# Patient Record
Sex: Male | Born: 1970 | Race: White | Hispanic: No | Marital: Married | Smoking: Former smoker
Health system: Southern US, Community
[De-identification: ages and names within clinical notes are randomized; demographics above are authoritative.]

## PROBLEM LIST (undated history)

## (undated) HISTORY — PX: OTHER SURGICAL HISTORY: SHX169

---

## 2001-07-24 ENCOUNTER — Encounter: Payer: Self-pay | Admitting: Emergency Medicine

## 2001-07-24 ENCOUNTER — Emergency Department (HOSPITAL_COMMUNITY): Admission: EM | Admit: 2001-07-24 | Discharge: 2001-07-24 | Payer: Self-pay | Admitting: *Deleted

## 2001-07-27 ENCOUNTER — Encounter: Payer: Self-pay | Admitting: Emergency Medicine

## 2001-07-27 ENCOUNTER — Emergency Department (HOSPITAL_COMMUNITY): Admission: EM | Admit: 2001-07-27 | Discharge: 2001-07-27 | Payer: Self-pay | Admitting: Emergency Medicine

## 2001-08-07 ENCOUNTER — Emergency Department (HOSPITAL_COMMUNITY): Admission: EM | Admit: 2001-08-07 | Discharge: 2001-08-07 | Payer: Self-pay | Admitting: Emergency Medicine

## 2002-01-14 ENCOUNTER — Emergency Department (HOSPITAL_COMMUNITY): Admission: EM | Admit: 2002-01-14 | Discharge: 2002-01-14 | Payer: Self-pay | Admitting: Emergency Medicine

## 2005-01-17 ENCOUNTER — Ambulatory Visit: Payer: Self-pay | Admitting: Internal Medicine

## 2006-05-18 ENCOUNTER — Emergency Department (HOSPITAL_COMMUNITY): Admission: EM | Admit: 2006-05-18 | Discharge: 2006-05-18 | Payer: Self-pay | Admitting: Emergency Medicine

## 2008-04-02 ENCOUNTER — Emergency Department (HOSPITAL_COMMUNITY): Admission: EM | Admit: 2008-04-02 | Discharge: 2008-04-02 | Payer: Self-pay | Admitting: Emergency Medicine

## 2010-09-15 ENCOUNTER — Emergency Department (HOSPITAL_COMMUNITY)
Admission: EM | Admit: 2010-09-15 | Discharge: 2010-09-15 | Disposition: A | Discharge status: Home / Self Care | Payer: Self-pay | Attending: Emergency Medicine | Admitting: Emergency Medicine

## 2010-09-15 DIAGNOSIS — J069 Acute upper respiratory infection, unspecified: Secondary | ICD-10-CM | POA: Insufficient documentation

## 2010-09-15 DIAGNOSIS — R05 Cough: Secondary | ICD-10-CM | POA: Insufficient documentation

## 2010-09-15 DIAGNOSIS — R059 Cough, unspecified: Secondary | ICD-10-CM | POA: Insufficient documentation

## 2010-09-15 LAB — RAPID STREP SCREEN (MED CTR MEBANE ONLY): Streptococcus, Group A Screen (Direct): NEGATIVE

## 2011-01-03 ENCOUNTER — Emergency Department (HOSPITAL_COMMUNITY)
Admission: EM | Admit: 2011-01-03 | Discharge: 2011-01-03 | Disposition: A | Discharge status: Home / Self Care | Payer: Self-pay | Attending: Emergency Medicine | Admitting: Emergency Medicine

## 2011-01-03 DIAGNOSIS — S335XXA Sprain of ligaments of lumbar spine, initial encounter: Secondary | ICD-10-CM | POA: Insufficient documentation

## 2011-01-03 DIAGNOSIS — X503XXA Overexertion from repetitive movements, initial encounter: Secondary | ICD-10-CM | POA: Insufficient documentation

## 2011-01-03 DIAGNOSIS — M549 Dorsalgia, unspecified: Secondary | ICD-10-CM | POA: Insufficient documentation

## 2012-01-16 ENCOUNTER — Ambulatory Visit (HOSPITAL_BASED_OUTPATIENT_CLINIC_OR_DEPARTMENT_OTHER)
Admission: RE | Admit: 2012-01-16 | Discharge: 2012-01-16 | Disposition: A | Discharge status: Home / Self Care | Payer: BC Managed Care – PPO | Source: Ambulatory Visit | Attending: Internal Medicine | Admitting: Internal Medicine

## 2012-01-16 ENCOUNTER — Ambulatory Visit (INDEPENDENT_AMBULATORY_CARE_PROVIDER_SITE_OTHER): Payer: BC Managed Care – PPO | Admitting: Internal Medicine

## 2012-01-16 ENCOUNTER — Other Ambulatory Visit: Payer: Self-pay | Admitting: Internal Medicine

## 2012-01-16 ENCOUNTER — Encounter: Payer: Self-pay | Admitting: Internal Medicine

## 2012-01-16 VITALS — BP 110/64 | HR 89 | Temp 98.3°F | Resp 16 | Ht 71.0 in | Wt 185.0 lb

## 2012-01-16 DIAGNOSIS — N509 Disorder of male genital organs, unspecified: Secondary | ICD-10-CM | POA: Insufficient documentation

## 2012-01-16 DIAGNOSIS — N50811 Right testicular pain: Secondary | ICD-10-CM

## 2012-01-16 DIAGNOSIS — M25529 Pain in unspecified elbow: Secondary | ICD-10-CM

## 2012-01-16 DIAGNOSIS — I861 Scrotal varices: Secondary | ICD-10-CM | POA: Insufficient documentation

## 2012-01-16 DIAGNOSIS — N50819 Testicular pain, unspecified: Secondary | ICD-10-CM

## 2012-01-16 MED ORDER — LEVOFLOXACIN 500 MG PO TABS: 500.0000 mg | ORAL_TABLET | Freq: Every day | ORAL | Status: DC

## 2012-01-16 MED ORDER — DICLOFENAC SODIUM 75 MG PO TBEC: DELAYED_RELEASE_TABLET | ORAL | Status: AC

## 2012-01-17 LAB — URINALYSIS, ROUTINE W REFLEX MICROSCOPIC

## 2012-01-21 DIAGNOSIS — M25529 Pain in unspecified elbow: Secondary | ICD-10-CM | POA: Insufficient documentation

## 2012-01-21 DIAGNOSIS — N50819 Testicular pain, unspecified: Secondary | ICD-10-CM | POA: Insufficient documentation

## 2012-01-21 NOTE — Assessment & Plan Note (Signed)
Consider epicondylitis. Attempt Voltaren with food and no other anti-inflammatories. Followup if no improvement or worsening.

## 2012-01-21 NOTE — Progress Notes (Signed)
  Subjective:    Patient ID: Dwayne Jordan, male    DOB: [birth year], 41 y.o.   MRN: [id number]  HPI patient presents to clinic for evaluation of right testicular pain. Notes intermittent discomfort over the past month of right testicle sometimes radiating into the groin. No urinary symptoms denying dysuria frequency or urethral discharge. No alleviating or exacerbating factors. Also notes left elbow pain in the general area of the chest up border vs. epicondylar area. Denies neck pain or radicular arm pain. Pain worse with pronation.  No past medical history on file. Past Surgical History  Procedure Date  . Unremarkable     reports that he has been smoking Cigarettes.  He does not have any smokeless tobacco history on file. His alcohol and drug histories not on file. family history includes Hypertension in his father. No Known Allergies   Review of Systems  Genitourinary: Positive for testicular pain. Negative for dysuria, urgency, frequency, hematuria, difficulty urinating and penile pain.  Musculoskeletal: Positive for arthralgias. Negative for myalgias, back pain and joint swelling.  All other systems reviewed and are negative.       Objective:   Physical Exam  Nursing note and vitals reviewed. Constitutional: He appears well-developed and well-nourished. No distress.  Genitourinary:       Bilaterally descended testes. No nodularity noted. Left epididymal tenderness without mass.  Musculoskeletal:       Left elbow no erythema warmth or effusion. Full range of motion. Tenderness near the lateral epicondyle. No bony abnormality.  Skin: He is not diaphoretic.          Assessment & Plan:

## 2012-01-21 NOTE — Assessment & Plan Note (Signed)
Obtain testicular ultrasound. Obtain urinalysis. Consider epididymitis and begin Levaquin. Followup improvement or worsening.

## 2012-02-06 ENCOUNTER — Ambulatory Visit: Payer: BC Managed Care – PPO | Admitting: Internal Medicine

## 2012-02-15 ENCOUNTER — Ambulatory Visit: Payer: BC Managed Care – PPO | Admitting: Internal Medicine

## 2012-02-21 ENCOUNTER — Ambulatory Visit (INDEPENDENT_AMBULATORY_CARE_PROVIDER_SITE_OTHER): Payer: BC Managed Care – PPO | Admitting: Internal Medicine

## 2012-02-21 ENCOUNTER — Encounter: Payer: Self-pay | Admitting: Internal Medicine

## 2012-02-21 VITALS — BP 108/78 | HR 76 | Temp 98.2°F | Resp 16 | Wt 185.5 lb

## 2012-02-21 DIAGNOSIS — N509 Disorder of male genital organs, unspecified: Secondary | ICD-10-CM

## 2012-02-21 DIAGNOSIS — N50819 Testicular pain, unspecified: Secondary | ICD-10-CM

## 2012-02-21 MED ORDER — LEVOFLOXACIN 500 MG PO TABS: 500.0000 mg | ORAL_TABLET | Freq: Every day | ORAL | Status: AC

## 2012-02-25 NOTE — Progress Notes (Signed)
  Subjective:    Patient ID: Dwayne Jordan, male    DOB: July 16, 1970, 41 y.o.   MRN: [id number]  HPI Pt presents to clinic for followup of multiple medical problems. Recent right testicular and inguinal pain improved but not resolved with antibiotic course. No dysuria or hematuria fever or chills. No alleviating or exacerbating factors. Declines influenza vaccine  No past medical history on file. Past Surgical History  Procedure Date  . Unremarkable     reports that he has been smoking Cigarettes.  He does not have any smokeless tobacco history on file. His alcohol and drug histories not on file. family history includes Hypertension in his father. No Known Allergies    Review of Systems see hpi    Objective:   Physical Exam  Nursing note and vitals reviewed. Constitutional: He appears well-developed and well-nourished. No distress.  HENT:  Head: Normocephalic and atraumatic.  Skin: He is not diaphoretic.          Assessment & Plan:

## 2012-02-25 NOTE — Assessment & Plan Note (Signed)
Improved. Reattempt antibiotic course. Followup if no improvement or worsening.

## 2012-03-27 ENCOUNTER — Telehealth: Payer: Self-pay | Admitting: Internal Medicine

## 2012-03-27 MED ORDER — DICLOFENAC SODIUM 75 MG PO TBEC: 75.0000 mg | DELAYED_RELEASE_TABLET | Freq: Two times a day (BID) | ORAL | Status: DC | PRN

## 2012-03-27 NOTE — Telephone Encounter (Signed)
Rx to pharmacy/[facility] 

## 2012-03-27 NOTE — Telephone Encounter (Signed)
Error/kjh 

## 2012-03-27 NOTE — Telephone Encounter (Signed)
Rx last filled 01/16/12 #30 x no refills. Take 1 tablet twice daily with food as needed for elbow pain.  Please advise re: future refills.

## 2012-03-27 NOTE — Telephone Encounter (Signed)
Pharmacist Daron Offer. [facility] [address] - states pt called here for a refill of Voltaren. [facility] location closed. He has no orders on this pt and would need that sent over.

## 2012-03-27 NOTE — Telephone Encounter (Signed)
75mg  bid prn #30 rf 1

## 2012-09-24 ENCOUNTER — Ambulatory Visit (INDEPENDENT_AMBULATORY_CARE_PROVIDER_SITE_OTHER): Payer: Self-pay | Admitting: Family

## 2012-09-24 ENCOUNTER — Ambulatory Visit (HOSPITAL_BASED_OUTPATIENT_CLINIC_OR_DEPARTMENT_OTHER)
Admission: RE | Admit: 2012-09-24 | Discharge: 2012-09-24 | Disposition: A | Discharge status: Home / Self Care | Payer: Self-pay | Source: Ambulatory Visit | Attending: Family | Admitting: Family

## 2012-09-24 ENCOUNTER — Encounter (HOSPITAL_BASED_OUTPATIENT_CLINIC_OR_DEPARTMENT_OTHER): Payer: Self-pay

## 2012-09-24 ENCOUNTER — Encounter: Payer: Self-pay | Admitting: Family

## 2012-09-24 VITALS — BP 110/70 | HR 68 | Temp 97.8°F | Resp 16 | Wt 197.0 lb

## 2012-09-24 DIAGNOSIS — N50819 Testicular pain, unspecified: Secondary | ICD-10-CM

## 2012-09-24 DIAGNOSIS — N509 Disorder of male genital organs, unspecified: Secondary | ICD-10-CM

## 2012-09-24 DIAGNOSIS — R1031 Right lower quadrant pain: Secondary | ICD-10-CM | POA: Insufficient documentation

## 2012-09-24 MED ORDER — IOHEXOL 300 MG/ML  SOLN
100.0000 mL | Freq: Once | INTRAMUSCULAR | Status: AC | PRN
  Administered 2012-09-24: 100 mL via INTRAVENOUS

## 2012-09-24 NOTE — Assessment & Plan Note (Addendum)
Need to rule out Inguinal hernia and acute appendicitis.. Will order CT abdomen/pelvis.

## 2012-09-24 NOTE — Patient Instructions (Addendum)
Please complete CT scan on the first floor. We will contact you with your results.  Call if symptoms worsen, or if not improved in 1 week.

## 2012-09-24 NOTE — Progress Notes (Signed)
  Subjective:    Patient ID: Dwayne Jordan, male    DOB: Apr 28, 1971, 42 y.o.   MRN: [id number]  HPI  Dwayne Jordan is a 42 yr old male who presents today with chief complaint of right testicular pain. Pain started Saturday.  Patient notes that he had some associated right lower abdominal pain. Stabbing pain, not made worse by lifting.  Denies testicular swelling, denies redness, denies fever.  Denies hematuria or dysuria.  Reports previous hx of similar symptoms. Chart reviewed- he had neg testicular ultrasound at that time. He reports a lot of heavy lifting at his job.  Review of Systems    see HPI  No past medical history on file.  History   Social History  . Marital Status: Married    Spouse Name: N/A    Number of Children: N/A  . Years of Education: N/A   Occupational History  . Not on file.   Social History Main Topics  . Smoking status: Current Every Day Smoker    Types: Cigarettes  . Smokeless tobacco: Not on file  . Alcohol Use: Not on file  . Drug Use: Not on file  . Sexually Active: Not on file   Other Topics Concern  . Not on file   Social History Narrative  . No narrative on file    Past Surgical History  Procedure Laterality Date  . Unremarkable      Family History  Problem Relation Age of Onset  . Hypertension Dwayne Jordan     No Known Allergies  Current Outpatient Prescriptions on File Prior to Visit  Medication Sig Dispense Refill  . diclofenac (VOLTAREN) 75 MG EC tablet Take 1 tablet (75 mg total) by mouth 2 (two) times daily as needed.  30 tablet  1   No current facility-administered medications on file prior to visit.    BP 110/70  Pulse 68  Temp(Src) 97.8 F (36.6 C) (Oral)  Resp 16  Wt 197 lb (89.359 kg)  BMI 27.49 kg/m2  SpO2 99%    Objective:   Physical Exam  Constitutional: He is oriented to person, place, and time. He appears well-developed and well-nourished. No distress.  HENT:  Head: Normocephalic and atraumatic.   Cardiovascular: Normal rate and regular rhythm.   No murmur heard. Pulmonary/Chest: Effort normal and breath sounds normal. No respiratory distress. He has no wheezes. He has no rales. He exhibits no tenderness.  Abdominal:  + RLQ tenderness   Neurological: He is alert and oriented to person, place, and time.  Skin: Skin is warm and dry.  Psychiatric: He has a normal mood and affect. His behavior is normal. Judgment and thought content normal.  GU: R testicle is without tenderness and without swelling on exam, no testicular masses.        Assessment & Plan:

## 2012-09-25 ENCOUNTER — Telehealth: Payer: Self-pay | Admitting: Family

## 2012-09-25 ENCOUNTER — Telehealth: Payer: Self-pay | Admitting: Internal Medicine

## 2012-09-25 MED ORDER — CIPROFLOXACIN HCL 500 MG PO TABS: 500.0000 mg | ORAL_TABLET | Freq: Two times a day (BID) | ORAL | Status: DC

## 2012-09-25 MED ORDER — LEVOFLOXACIN 500 MG PO TABS: 500.0000 mg | ORAL_TABLET | Freq: Every day | ORAL | Status: DC

## 2012-09-25 NOTE — Telephone Encounter (Signed)
Please advise dose / directions of levaquin as I do not see rx on current med list.

## 2012-09-25 NOTE — Telephone Encounter (Signed)
Rx sent for cipro. Hopefully this is cheaper.  I think it is on 4$ plan at [facility] if they find it too expensive at [facility].

## 2012-09-25 NOTE — Telephone Encounter (Signed)
Pain could be related to prostatitis.  Plan levaquin 500mg  daily x 10 days.  rx sent to pharmacy.

## 2012-09-25 NOTE — Telephone Encounter (Signed)
Pls call pt and let him know CT is normal. No hernia.  I recommend levaquin as ordered yesterday. Pt to call if symptoms worsen or if not improved in 2-3 days.

## 2012-09-25 NOTE — Telephone Encounter (Signed)
Patients wife called in stating that patient has no insurance and the medicine that Melissa prescribed is over $100. Is there anything else that patient could take that would be cheaper?

## 2012-09-25 NOTE — Telephone Encounter (Signed)
Patient is requesting CT results from yesterday

## 2012-09-25 NOTE — Telephone Encounter (Signed)
Patient came to the office.  Read him the information on the CT.  Please have [facility] [address]

## 2012-09-25 NOTE — Telephone Encounter (Signed)
Unable to leave message on pt's cell#.

## 2012-09-25 NOTE — Telephone Encounter (Signed)
Patients wife called regarding this. I explained to her that her husband did not sign a DPR and that we could not release info to her, only to him. I explained that someone would give patient a call when we know of results.

## 2012-09-26 NOTE — Telephone Encounter (Signed)
Notified pt's wife  

## 2012-10-10 ENCOUNTER — Ambulatory Visit: Payer: Self-pay | Admitting: Internal Medicine

## 2012-10-15 ENCOUNTER — Ambulatory Visit: Payer: Self-pay | Admitting: Family

## 2012-10-15 DIAGNOSIS — Z0289 Encounter for other administrative examinations: Secondary | ICD-10-CM

## 2012-10-17 ENCOUNTER — Ambulatory Visit (INDEPENDENT_AMBULATORY_CARE_PROVIDER_SITE_OTHER): Payer: Self-pay | Admitting: Family

## 2012-10-17 ENCOUNTER — Encounter: Payer: Self-pay | Admitting: Family

## 2012-10-17 VITALS — BP 126/80 | HR 62 | Temp 97.9°F | Resp 16 | Wt 200.1 lb

## 2012-10-17 DIAGNOSIS — N50811 Right testicular pain: Secondary | ICD-10-CM

## 2012-10-17 DIAGNOSIS — N50819 Testicular pain, unspecified: Secondary | ICD-10-CM

## 2012-10-17 DIAGNOSIS — N509 Disorder of male genital organs, unspecified: Secondary | ICD-10-CM

## 2012-10-17 MED ORDER — LEVOFLOXACIN 500 MG PO TABS: 500.0000 mg | ORAL_TABLET | Freq: Every day | ORAL | Status: DC

## 2012-10-17 NOTE — Assessment & Plan Note (Signed)
Will obtain Ua/Culture, GC/Chlamydia.  Re-attempt empiric levaquin for possible epididymitis/prostatitis . Refer to urology for further evaluation. The pt is instructed to go to the ER if sever abdominal pain- verbalizes understanding.

## 2012-10-17 NOTE — Patient Instructions (Signed)
You will be contacted about your referral to urology.   Please complete lab work prior to leaving.

## 2012-10-17 NOTE — Progress Notes (Signed)
  Subjective:    Patient ID: Dwayne Jordan, male    DOB: 1971-03-09, 42 y.o.   MRN: [id number]  HPI  Dwayne Jordan is a 42 yr old male who presents today with chief complaint of RLQ pain which has been present x 1 week. Describes pain as starting in the right testicle and shooting up into the right lower abdomen.  Pain is intermittent and has occurred 3-4 times over the last week.  He was evaluated for same in September 9/13 and underwent testicular US which was unremarkable.  He was treated with levaquin and symptoms improved. He then presented in May with similar symptoms and a CT abd/pelvis was performed and was also unremarkable.  He was treated with levaquin and reports that symptoms resolved briefly. Denies dysuria, denies fever, testicular swelling.  Denies aggravating or alleviating factors.   Review of Systems See HPI  No past medical history on file.  History   Social History  . Marital Status: Married    Spouse Name: N/A    Number of Children: N/A  . Years of Education: N/A   Occupational History  . Not on file.   Social History Main Topics  . Smoking status: Current Every Day Smoker    Types: Cigarettes  . Smokeless tobacco: Not on file  . Alcohol Use: Not on file  . Drug Use: Not on file  . Sexually Active: Not on file   Other Topics Concern  . Not on file   Social History Narrative  . No narrative on file    Past Surgical History  Procedure Laterality Date  . Unremarkable      Family History  Problem Relation Age of Onset  . Hypertension Father     No Known Allergies  No current outpatient prescriptions on file prior to visit.   No current facility-administered medications on file prior to visit.    BP 126/80  Pulse 62  Temp(Src) 97.9 F (36.6 C) (Oral)  Resp 16  Wt 200 lb 1.3 oz (90.756 kg)  BMI 27.92 kg/m2  SpO2 99%       Objective:   Physical Exam  Constitutional: He is oriented to person, place, and time. He appears  well-developed and well-nourished. No distress.  Cardiovascular: Normal rate and regular rhythm.   No murmur heard. Pulmonary/Chest: Effort normal and breath sounds normal. No respiratory distress. He has no wheezes. He has no rales. He exhibits no tenderness.  Abdominal: Soft. He exhibits no distension.  Very mild RLQ tenderness without guarding  Neurological: He is alert and oriented to person, place, and time.  Psychiatric: He has a normal mood and affect. His behavior is normal. Judgment and thought content normal.          Assessment & Plan:

## 2012-10-18 LAB — URINALYSIS, ROUTINE W REFLEX MICROSCOPIC
Glucose, UA: NEGATIVE mg/dL
Ketones, ur: NEGATIVE mg/dL
Leukocytes, UA: NEGATIVE
Nitrite: NEGATIVE
Specific Gravity, Urine: 1.009 (ref 1.005–1.030)
pH: 7 (ref 5.0–8.0)

## 2012-10-18 LAB — GC/CHLAMYDIA PROBE AMP, URINE: GC Probe Amp, Urine: NEGATIVE

## 2012-10-19 LAB — URINE CULTURE
Colony Count: NO GROWTH
Organism ID, Bacteria: NO GROWTH

## 2012-10-29 ENCOUNTER — Telehealth: Payer: Self-pay | Admitting: *Deleted

## 2012-10-29 NOTE — Telephone Encounter (Signed)
Received message from pt's wife requesting referral for pt to see a podiatrist for his "fallen arches".  Please advise.

## 2012-11-05 ENCOUNTER — Ambulatory Visit (INDEPENDENT_AMBULATORY_CARE_PROVIDER_SITE_OTHER): Payer: Self-pay | Admitting: Podiatry

## 2012-11-05 ENCOUNTER — Encounter: Payer: Self-pay | Admitting: Podiatry

## 2012-11-05 VITALS — BP 128/72 | HR 67

## 2012-11-05 DIAGNOSIS — M722 Plantar fascial fibromatosis: Secondary | ICD-10-CM

## 2012-11-05 DIAGNOSIS — M21969 Unspecified acquired deformity of unspecified lower leg: Secondary | ICD-10-CM | POA: Insufficient documentation

## 2012-11-05 MED ORDER — ARCH BANDAGE MISC: 1.0000 | Freq: Once | Status: DC

## 2012-11-05 NOTE — Progress Notes (Signed)
Subjective: 42 year old male presents with wife complaining of bilateral foot pain with knot in arch area.  On feet 10-12 hrs/day, 5 days per week. Left foot hurt over a year, right is recent, 6 months. Wears Tennis shoes at work. Pain in mid arch on both feet, and lateral plantar band on right.  Patient was referred by PCP Peggyann Juba.   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 Broad palpable fibrotic mass over plantar medial band at mid point bilateral. Ankle range of motion is within normal. Neurovascular status are within normal.  No abnormal dermatologic findings.   Assessment: Plantar fibroma bilateral. Plantar fasciitis bilateral. Hypermobile first ray bilateral.  Plan: Dispensed Metatarsal binder for added stability of the first MCJ bilateral. Advised to get Orthotics. Also reviewed corrective procedures.  Patient will try OTC inserts first. Return in one month.

## 2012-12-05 ENCOUNTER — Ambulatory Visit: Payer: Self-pay | Admitting: Podiatry

## 2012-12-14 ENCOUNTER — Ambulatory Visit: Payer: Self-pay | Admitting: Podiatry

## 2013-01-16 ENCOUNTER — Ambulatory Visit: Payer: Self-pay | Admitting: Family Medicine

## 2013-01-16 VITALS — BP 130/88 | HR 72 | Temp 98.7°F | Resp 16 | Ht 71.5 in | Wt 194.0 lb

## 2013-01-16 DIAGNOSIS — Z0000 Encounter for general adult medical examination without abnormal findings: Secondary | ICD-10-CM

## 2013-01-16 DIAGNOSIS — Z0289 Encounter for other administrative examinations: Secondary | ICD-10-CM

## 2013-01-16 NOTE — Progress Notes (Signed)
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 Please see the form that has been completed. Patient passes all aspects of his physical.

## 2013-01-22 ENCOUNTER — Encounter: Payer: Self-pay | Admitting: Family Medicine

## 2013-02-11 ENCOUNTER — Emergency Department (HOSPITAL_BASED_OUTPATIENT_CLINIC_OR_DEPARTMENT_OTHER)
Admission: EM | Admit: 2013-02-11 | Discharge: 2013-02-11 | Disposition: A | Discharge status: Home / Self Care | Payer: Self-pay | Attending: Emergency Medicine | Admitting: Emergency Medicine

## 2013-02-11 ENCOUNTER — Encounter (HOSPITAL_BASED_OUTPATIENT_CLINIC_OR_DEPARTMENT_OTHER): Payer: Self-pay

## 2013-02-11 DIAGNOSIS — M5430 Sciatica, unspecified side: Secondary | ICD-10-CM | POA: Insufficient documentation

## 2013-02-11 DIAGNOSIS — M5432 Sciatica, left side: Secondary | ICD-10-CM

## 2013-02-11 DIAGNOSIS — F17200 Nicotine dependence, unspecified, uncomplicated: Secondary | ICD-10-CM | POA: Insufficient documentation

## 2013-02-11 MED ORDER — PREDNISONE 10 MG PO TABS: 20.0000 mg | ORAL_TABLET | Freq: Every day | ORAL | Status: DC

## 2013-02-11 MED ORDER — HYDROCODONE-ACETAMINOPHEN 5-325 MG PO TABS: ORAL_TABLET | ORAL | Status: DC

## 2013-02-11 MED ORDER — PREDNISONE 10 MG PO TABS: ORAL_TABLET | ORAL | Status: DC

## 2013-02-11 MED ORDER — HYDROCODONE-ACETAMINOPHEN 5-325 MG PO TABS: 1.0000 | ORAL_TABLET | ORAL | Status: DC | PRN

## 2013-02-11 MED ORDER — CYCLOBENZAPRINE HCL 10 MG PO TABS: 10.0000 mg | ORAL_TABLET | Freq: Two times a day (BID) | ORAL | Status: DC | PRN

## 2013-02-11 NOTE — ED Provider Notes (Signed)
CSN: [id number]     Arrival date & time 02/11/13  1352 History   First MD Initiated Contact with Patient 02/11/13 1403     Chief Complaint  Patient presents with  . Back Pain   (Consider location/radiation/quality/duration/timing/severity/associated sxs/prior Treatment) HPI Comments: Pt state that he is having left lower pain pain with radiation down his leg;pt states that it happened about a month ago and it resolved on its own,but then it started again yesterday:no definite injury although pt does a lot of heavy lifting for a job  Patient is a 42 y.o. male presenting with back pain. The history is provided by the patient. No language interpreter was used.  Back Pain Quality:  Aching and stabbing Radiates to:  L posterior upper leg Pain severity:  Moderate Pain is:  Same all the time Onset quality:  Sudden Timing:  Constant Progression:  Unchanged Chronicity:  Recurrent Relieved by:  Nothing Ineffective treatments:  OTC medications and heating pad Associated symptoms: no bladder incontinence, no bowel incontinence, no numbness and no perianal numbness     History reviewed. No pertinent past medical history. Past Surgical History  Procedure Laterality Date  . Unremarkable     Family History  Problem Relation Age of Onset  . Hypertension Father    History  Substance Use Topics  . Smoking status: Current Every Day Smoker -- 1.00 packs/day for 30 years    Types: Cigarettes  . Smokeless tobacco: Not on file  . Alcohol Use: .5 - 1 oz/week    1-2 drink(s) per week     Comment: occasionally    Review of Systems  Gastrointestinal: Negative for bowel incontinence.  Genitourinary: Negative for bladder incontinence.  Musculoskeletal: Positive for back pain.  Neurological: Negative for numbness.    Allergies  Review of patient's allergies indicates no known allergies.  Home Medications   Current Outpatient Rx  Name  Route  Sig  Dispense  Refill  . cyclobenzaprine  (FLEXERIL) 10 MG tablet   Oral   Take 1 tablet (10 mg total) by mouth 2 (two) times daily as needed for muscle spasms.   20 tablet   0   . HYDROcodone-acetaminophen (NORCO/VICODIN) 5-325 MG per tablet      6 day step down dose   21 tablet   0   . predniSONE (DELTASONE) 10 MG tablet   Oral   Take 2 tablets (20 mg total) by mouth daily.   15 tablet   0    BP 119/72  Pulse 65  Temp(Src) 97.5 F (36.4 C) (Oral)  Resp 18  SpO2 97% Physical Exam  ED Course  Procedures (including critical care time) Labs Review Labs Reviewed - No data to display Imaging Review No results found.  MDM   1. Sciatic pain, left    Pt is neurologically intact:no red flags:pt treated symptomatically and given follow up with DR. Vivi Barrack, NP 02/11/13 1438

## 2013-02-11 NOTE — ED Notes (Signed)
Lower back pain since yesterday morning, sharp pain, "possibly pulled a muscle", radiating to left leg.

## 2013-02-12 NOTE — ED Provider Notes (Signed)
Medical screening examination/treatment/procedure(s) were performed by non-physician practitioner and as supervising physician I was immediately available for consultation/collaboration.   Candyce Churn, MD 02/12/13 [id number]

## 2013-04-19 ENCOUNTER — Encounter: Payer: Self-pay | Admitting: Physician Assistant

## 2013-04-19 ENCOUNTER — Ambulatory Visit: Payer: Self-pay | Admitting: Physician Assistant

## 2013-04-19 ENCOUNTER — Ambulatory Visit (INDEPENDENT_AMBULATORY_CARE_PROVIDER_SITE_OTHER): Payer: Self-pay | Admitting: Physician Assistant

## 2013-04-19 VITALS — BP 126/70 | HR 78 | Temp 98.6°F | Wt 195.8 lb

## 2013-04-19 DIAGNOSIS — H6690 Otitis media, unspecified, unspecified ear: Secondary | ICD-10-CM

## 2013-04-19 DIAGNOSIS — H6692 Otitis media, unspecified, left ear: Secondary | ICD-10-CM

## 2013-04-19 MED ORDER — AMOXICILLIN 875 MG PO TABS: 875.0000 mg | ORAL_TABLET | Freq: Two times a day (BID) | ORAL | Status: DC

## 2013-04-19 NOTE — Patient Instructions (Signed)
Increase fluid intake.  Take antibiotic as prescribed until all tablets are gone.  Rest.  Humidifier in bedroom.  Saline nasal spray.  Zyrtec at bedtime.  Warm liquids, Chloraseptic spray, salt-water gargles and Tylenol for sore throat.

## 2013-04-19 NOTE — Progress Notes (Signed)
Pre-visit discussion using our clinic review tool. No additional management support is needed unless otherwise documented below in the visit note.  

## 2013-04-19 NOTE — Progress Notes (Signed)
Patient ID: Dwayne Jordan, male   DOB: Mar 19, 1971, 42 y.o.   MRN: [id number]  Patient presents to clinic today complaining of one week of nasal congestion, postnasal drip, sore throat, mild cough and left ear pain. Patient denies fever, muscle aches or chills. Denies tooth pain. Patient denies shortness of breath and wheezing. Denies history of asthma or allergy. Denies recent travel or sick contact.    No past medical history on file.  No current outpatient prescriptions on file prior to visit.   No current facility-administered medications on file prior to visit.    No Known Allergies  Family History  Problem Relation Age of Onset  . Hypertension Father     History   Social History  . Marital Status: Married    Spouse Name: N/A    Number of Children: N/A  . Years of Education: N/A   Social History Main Topics  . Smoking status: Current Every Day Smoker -- 1.00 packs/day for 30 years    Types: Cigarettes  . Smokeless tobacco: None  . Alcohol Use: .5 - 1 oz/week    1-2 drink(s) per week     Comment: occasionally  . Drug Use: No  . Sexual Activity: None   Other Topics Concern  . None   Social History Narrative  . None   Review of Systems - see history of present illness. All other review of systems are negative.  Filed Vitals:   04/19/13 1601  BP: 126/70  Pulse: 78  Temp: 98.6 F (37 C)   Physical Exam  Vitals reviewed. Constitutional: He is oriented to person, place, and time and well-developed, well-nourished, and in no distress.  HENT:  Head: Normocephalic and atraumatic.  Right Ear: External ear normal.  Left Ear: External ear normal.  Nose: Nose normal.  Mouth/Throat: Oropharynx is clear and moist. No oropharyngeal exudate.  Right tympanic membrane within normal limits. Left tympanic membrane erythematous, dull and bulging. No tenderness noticed on percussion of sinuses.  Eyes: Conjunctivae are normal.  Neck: Neck supple.  Cardiovascular: Normal  rate, regular rhythm and normal heart sounds.   Pulmonary/Chest: Effort normal and breath sounds normal. No respiratory distress. He has no wheezes. He has no rales. He exhibits no tenderness.  Lymphadenopathy:    He has no cervical adenopathy.  Neurological: He is alert and oriented to person, place, and time.  Skin: Skin is warm and dry. No rash noted.  Psychiatric: Affect normal.   Assessment/Plan: Otitis media Rx amoxicillin. For sinus symptoms: increase fluid intake, rest, saline nasal spray, Claritin, humidifier in bedroom.

## 2013-04-19 NOTE — Assessment & Plan Note (Signed)
Rx amoxicillin. For sinus symptoms: increase fluid intake, rest, saline nasal spray, Claritin, humidifier in bedroom.

## 2014-05-12 ENCOUNTER — Encounter (HOSPITAL_BASED_OUTPATIENT_CLINIC_OR_DEPARTMENT_OTHER): Payer: Self-pay

## 2014-05-12 ENCOUNTER — Emergency Department (HOSPITAL_BASED_OUTPATIENT_CLINIC_OR_DEPARTMENT_OTHER)
Admission: EM | Admit: 2014-05-12 | Discharge: 2014-05-12 | Disposition: A | Discharge status: Home / Self Care | Payer: Self-pay | Attending: Emergency Medicine | Admitting: Emergency Medicine

## 2014-05-12 DIAGNOSIS — M5432 Sciatica, left side: Secondary | ICD-10-CM | POA: Insufficient documentation

## 2014-05-12 DIAGNOSIS — Z792 Long term (current) use of antibiotics: Secondary | ICD-10-CM | POA: Insufficient documentation

## 2014-05-12 DIAGNOSIS — Z720 Tobacco use: Secondary | ICD-10-CM | POA: Insufficient documentation

## 2014-05-12 LAB — URINALYSIS, ROUTINE W REFLEX MICROSCOPIC
Bilirubin Urine: NEGATIVE
GLUCOSE, UA: NEGATIVE mg/dL
Hgb urine dipstick: NEGATIVE
KETONES UR: NEGATIVE mg/dL
LEUKOCYTES UA: NEGATIVE
NITRITE: NEGATIVE
PH: 6.5 (ref 5.0–8.0)
PROTEIN: NEGATIVE mg/dL
Specific Gravity, Urine: 1.014 (ref 1.005–1.030)
Urobilinogen, UA: 0.2 mg/dL (ref 0.0–1.0)

## 2014-05-12 MED ORDER — IBUPROFEN 800 MG PO TABS
800.0000 mg | ORAL_TABLET | Freq: Once | ORAL | Status: AC
  Administered 2014-05-12: 800 mg via ORAL
  Filled 2014-05-12: qty 1

## 2014-05-12 MED ORDER — METHYLPREDNISOLONE (PAK) 4 MG PO TABS: ORAL_TABLET | ORAL | Status: DC

## 2014-05-12 MED ORDER — IBUPROFEN 800 MG PO TABS: 800.0000 mg | ORAL_TABLET | Freq: Three times a day (TID) | ORAL | Status: DC

## 2014-05-12 MED ORDER — OXYCODONE-ACETAMINOPHEN 5-325 MG PO TABS
2.0000 | ORAL_TABLET | Freq: Once | ORAL | Status: AC
  Administered 2014-05-12: 2 via ORAL
  Filled 2014-05-12: qty 2

## 2014-05-12 MED ORDER — HYDROCODONE-ACETAMINOPHEN 5-325 MG PO TABS: 2.0000 | ORAL_TABLET | ORAL | Status: DC | PRN

## 2014-05-12 NOTE — ED Notes (Signed)
MD at bedside to reassess

## 2014-05-12 NOTE — ED Notes (Signed)
MD at bedside. 

## 2014-05-12 NOTE — Discharge Instructions (Signed)
Sciatica Sciatica is pain, weakness, numbness, or tingling along the path of the sciatic nerve. The nerve starts in the lower back and runs down the back of each leg. The nerve controls the muscles in the lower leg and in the back of the knee, while also providing sensation to the back of the thigh, lower leg, and the sole of your foot. Sciatica is a symptom of another medical condition. For instance, nerve damage or certain conditions, such as a herniated disk or bone spur on the spine, pinch or put pressure on the sciatic nerve. This causes the pain, weakness, or other sensations normally associated with sciatica. Generally, sciatica only affects one side of the body. CAUSES   Herniated or slipped disc.  Degenerative disk disease.  A pain disorder involving the narrow muscle in the buttocks (piriformis syndrome).  Pelvic injury or fracture.  Pregnancy.  Tumor (rare). SYMPTOMS  Symptoms can vary from mild to very severe. The symptoms usually travel from the low back to the buttocks and down the back of the leg. Symptoms can include:  Mild tingling or dull aches in the lower back, leg, or hip.  Numbness in the back of the calf or sole of the foot.  Burning sensations in the lower back, leg, or hip.  Sharp pains in the lower back, leg, or hip.  Leg weakness.  Severe back pain inhibiting movement. These symptoms may get worse with coughing, sneezing, laughing, or prolonged sitting or standing. Also, being overweight may worsen symptoms. DIAGNOSIS  Your caregiver will perform a physical exam to look for common symptoms of sciatica. He or she may ask you to do certain movements or activities that would trigger sciatic nerve pain. Other tests may be performed to find the cause of the sciatica. These may include:  Blood tests.  X-rays.  Imaging tests, such as an MRI or CT scan. TREATMENT  Treatment is directed at the cause of the sciatic pain. Sometimes, treatment is not necessary  and the pain and discomfort goes away on its own. If treatment is needed, your caregiver may suggest:  Over-the-counter medicines to relieve pain.  Prescription medicines, such as anti-inflammatory medicine, muscle relaxants, or narcotics.  Applying heat or ice to the painful area.  Steroid injections to lessen pain, irritation, and inflammation around the nerve.  Reducing activity during periods of pain.  Exercising and stretching to strengthen your abdomen and improve flexibility of your spine. Your caregiver may suggest losing weight if the extra weight makes the back pain worse.  Physical therapy.  Surgery to eliminate what is pressing or pinching the nerve, such as a bone spur or part of a herniated disk. HOME CARE INSTRUCTIONS   Only take over-the-counter or prescription medicines for pain or discomfort as directed by your caregiver.  Apply ice to the affected area for 20 minutes, 3-4 times a day for the first 48-72 hours. Then try heat in the same way.  Exercise, stretch, or perform your usual activities if these do not aggravate your pain.  Attend physical therapy sessions as directed by your caregiver.  Keep all follow-up appointments as directed by your caregiver.  Do not wear high heels or shoes that do not provide proper support.  Check your mattress to see if it is too soft. A firm mattress may lessen your pain and discomfort. SEEK IMMEDIATE MEDICAL CARE IF:   You lose control of your bowel or bladder (incontinence).  You have increasing weakness in the lower back, pelvis, buttocks,   or legs.  You have redness or swelling of your back.  You have a burning sensation when you urinate.  You have pain that gets worse when you lie down or awakens you at night.  Your pain is worse than you have experienced in the past.  Your pain is lasting longer than 4 weeks.  You are suddenly losing weight without reason. MAKE SURE YOU:  Understand these  instructions.  Will watch your condition.  Will get help right away if you are not doing well or get worse. Document Released: 04/19/2001 Document Revised: 10/25/2011 Document Reviewed: 09/04/2011 [employer] Patient Information 2015 [employer]. This information is not intended to replace advice given to you by your health care provider. Make sure you discuss any questions you have with your health care provider.  

## 2014-05-12 NOTE — ED Notes (Signed)
Pt ambulatory to restroom and back no difficulty.

## 2014-05-12 NOTE — ED Notes (Signed)
Pt reports 2 weeks of pain in left flank area that radiates to left buttock and into groin.  Denies dysuria or blood in urine, no n/v.  Denies trauma or heavy lifting injury.

## 2014-05-12 NOTE — ED Provider Notes (Signed)
CSN: [id number]     Arrival date & time 05/12/14  4696 History  This chart was scribed for Glynn Octave, MD by Leone Payor, ED Scribe. This patient was seen in room MH09/MH09 and the patient's care was started 9:36 AM.    Chief Complaint  Patient presents with  . Back Pain    The history is provided by the patient. No language interpreter was used.     HPI Comments: Dwayne Jordan is a 44 y.o. male who presents to the Emergency Department complaining of a constant left lower back pain with radiation to the left buttocks which began a couple of weeks ago. Patient reports history of back pain in the past. He denies known injury or trauma to the affected area but states his job requires physical exertion and lifting. He states movement aggravates the pain and rest alleviates the pain. He reports occasional radiation to the left groin and testicle. He denies weakness, numbness, dysuria, hematuria, change in bowel or bladder function. He denies history of CA or IV drug use.   History reviewed. No pertinent past medical history. Past Surgical History  Procedure Laterality Date  . Unremarkable     Family History  Problem Relation Age of Onset  . Hypertension Father    History  Substance Use Topics  . Smoking status: Current Every Day Smoker -- 1.00 packs/day for 30 years    Types: Cigarettes  . Smokeless tobacco: Not on file  . Alcohol Use: 0.5 - 1.0 oz/week    1-2 drink(s) per week     Comment: occasionally    Review of Systems  A complete 10 system review of systems was obtained and all systems are negative except as noted in the HPI and PMH.    Allergies  Review of patient's allergies indicates no known allergies.  Home Medications   Prior to Admission medications   Medication Sig Start Date End Date Taking? Authorizing Provider  amoxicillin (AMOXIL) 875 MG tablet Take 1 tablet (875 mg total) by mouth 2 (two) times daily. 04/19/13   Waldon Merl, PA-C   HYDROcodone-acetaminophen (NORCO/VICODIN) 5-325 MG per tablet Take 2 tablets by mouth every 4 (four) hours as needed. 05/12/14   Glynn Octave, MD  ibuprofen (ADVIL,MOTRIN) 800 MG tablet Take 1 tablet (800 mg total) by mouth 3 (three) times daily. 05/12/14   Glynn Octave, MD  methylPREDNIsolone (MEDROL DOSPACK) 4 MG tablet follow package directions 05/12/14   Glynn Octave, MD   BP 121/85 mmHg  Pulse 61  Temp(Src) 98.4 F (36.9 C) (Oral)  Resp 18  Ht 6' (1.829 m)  Wt 190 lb (86.183 kg)  BMI 25.76 kg/m2  SpO2 99% Physical Exam  Constitutional: He is oriented to person, place, and time. He appears well-developed and well-nourished. No distress.  Uncomfortable appearing.   HENT:  Head: Normocephalic and atraumatic.  Mouth/Throat: Oropharynx is clear and moist. No oropharyngeal exudate.  Eyes: Conjunctivae and EOM are normal. Pupils are equal, round, and reactive to light.  Neck: Normal range of motion. Neck supple.  No meningismus.  Cardiovascular: Normal rate, regular rhythm, normal heart sounds and intact distal pulses.   No murmur heard. Pulmonary/Chest: Effort normal and breath sounds normal. No respiratory distress.  Abdominal: Soft. There is no tenderness. There is no rebound and no guarding.  Musculoskeletal: Normal range of motion. He exhibits tenderness. He exhibits no edema.  Left paraspinal tenderness. No midline tenderness. 5/5 strength in bilateral lower extremities. Ankle plantar and dorsiflexion intact. [employer]  toe extension intact bilaterally. +2 DP and PT pulses. +2 patellar reflexes bilaterally. Normal gait.   Neurological: He is alert and oriented to person, place, and time. No cranial nerve deficit. He exhibits normal muscle tone. Coordination normal.  No ataxia on finger to nose bilaterally. No pronator drift. 5/5 strength throughout. CN 2-12 intact. Negative Romberg. Equal grip strength. Sensation intact. Gait is normal.   Skin: Skin is warm.  Psychiatric: He has a  normal mood and affect. His behavior is normal.  Nursing note and vitals reviewed.   ED Course  Procedures (including critical care time)  DIAGNOSTIC STUDIES: Oxygen Saturation is 99% on RA, normal by my interpretation.    COORDINATION OF CARE: 9:40 AM Discussed treatment plan with pt at bedside and pt agreed to plan.   Labs Review Labs Reviewed  URINALYSIS, ROUTINE W REFLEX MICROSCOPIC    Imaging Review No results found.   EKG Interpretation None      MDM   Final diagnoses:  Sciatica, left   2 weeks of left-sided lower back pain it radiates in the buttock. Denies any injuries but lifts at work. Denies any focal weakness, numbness or tingling. No bowel or bladder incontinence. No fever or vomiting. Denies any urinary symptoms.  Neurovascularly intact on exam. No evidence of cauda equina or cord compression. UA negative.  Treat as sciatica. Pain control, anti-inflammatories, steroids, follow-up with PCP. No lifting over ten pounds. Return precautions discussed.  I personally performed the services described in this documentation, which was scribed in my presence. The recorded information has been reviewed and is accurate.   Glynn Octave, MD 05/12/14 1029

## 2014-10-04 ENCOUNTER — Emergency Department (HOSPITAL_BASED_OUTPATIENT_CLINIC_OR_DEPARTMENT_OTHER): Payer: Self-pay

## 2014-10-04 ENCOUNTER — Emergency Department (HOSPITAL_BASED_OUTPATIENT_CLINIC_OR_DEPARTMENT_OTHER)
Admission: EM | Admit: 2014-10-04 | Discharge: 2014-10-04 | Disposition: A | Discharge status: Home / Self Care | Payer: Self-pay | Attending: Emergency Medicine | Admitting: Emergency Medicine

## 2014-10-04 ENCOUNTER — Encounter (HOSPITAL_BASED_OUTPATIENT_CLINIC_OR_DEPARTMENT_OTHER): Payer: Self-pay

## 2014-10-04 DIAGNOSIS — M546 Pain in thoracic spine: Secondary | ICD-10-CM | POA: Insufficient documentation

## 2014-10-04 DIAGNOSIS — M549 Dorsalgia, unspecified: Secondary | ICD-10-CM

## 2014-10-04 DIAGNOSIS — Z720 Tobacco use: Secondary | ICD-10-CM | POA: Insufficient documentation

## 2014-10-04 DIAGNOSIS — Z791 Long term (current) use of non-steroidal anti-inflammatories (NSAID): Secondary | ICD-10-CM | POA: Insufficient documentation

## 2014-10-04 MED ORDER — HYDROCODONE-ACETAMINOPHEN 5-325 MG PO TABS: 1.0000 | ORAL_TABLET | ORAL | Status: DC | PRN

## 2014-10-04 MED ORDER — METHOCARBAMOL 500 MG PO TABS: 500.0000 mg | ORAL_TABLET | Freq: Three times a day (TID) | ORAL | Status: DC | PRN

## 2014-10-04 MED ORDER — NAPROXEN 500 MG PO TABS: 500.0000 mg | ORAL_TABLET | Freq: Two times a day (BID) | ORAL | Status: DC

## 2014-10-04 NOTE — Discharge Instructions (Signed)
Back Pain, Adult Low back pain is very common. About 1 in 5 people have back pain.The cause of low back pain is rarely dangerous. The pain often gets better over time.About half of people with a sudden onset of back pain feel better in just 2 weeks. About 8 in 10 people feel better by 6 weeks.  CAUSES Some common causes of back pain include:  Strain of the muscles or ligaments supporting the spine.  Wear and tear (degeneration) of the spinal discs.  Arthritis.  Direct injury to the back. DIAGNOSIS Most of the time, the direct cause of low back pain is not known.However, back pain can be treated effectively even when the exact cause of the pain is unknown.Answering your caregiver's questions about your overall health and symptoms is one of the most accurate ways to make sure the cause of your pain is not dangerous. If your caregiver needs more information, he or she may order lab work or imaging tests (X-rays or MRIs).However, even if imaging tests show changes in your back, this usually does not require surgery. HOME CARE INSTRUCTIONS For many people, back pain returns.Since low back pain is rarely dangerous, it is often a condition that people can learn to manageon their own.   Remain active. It is stressful on the back to sit or stand in one place. Do not sit, drive, or stand in one place for more than 30 minutes at a time. Take short walks on level surfaces as soon as pain allows.Try to increase the length of time you walk each day.  Do not stay in bed.Resting more than 1 or 2 days can delay your recovery.  Do not avoid exercise or work.Your body is made to move.It is not dangerous to be active, even though your back may hurt.Your back will likely heal faster if you return to being active before your pain is gone.  Pay attention to your body when you bend and lift. Many people have less discomfortwhen lifting if they bend their knees, keep the load close to their bodies,and  avoid twisting. Often, the most comfortable positions are those that put less stress on your recovering back.  Find a comfortable position to sleep. Use a firm mattress and lie on your side with your knees slightly bent. If you lie on your back, put a pillow under your knees.  Only take over-the-counter or prescription medicines as directed by your caregiver. Over-the-counter medicines to reduce pain and inflammation are often the most helpful.Your caregiver may prescribe muscle relaxant drugs.These medicines help dull your pain so you can more quickly return to your normal activities and healthy exercise.  Put ice on the injured area.  Put ice in a plastic bag.  Place a towel between your skin and the bag.  Leave the ice on for 15-20 minutes, 03-04 times a day for the first 2 to 3 days. After that, ice and heat may be alternated to reduce pain and spasms.  Ask your caregiver about trying back exercises and gentle massage. This may be of some benefit.  Avoid feeling anxious or stressed.Stress increases muscle tension and can worsen back pain.It is important to recognize when you are anxious or stressed and learn ways to manage it.Exercise is a great option. SEEK MEDICAL CARE IF:  You have pain that is not relieved with rest or medicine.  You have pain that does not improve in 1 week.  You have new symptoms.  You are generally not feeling well. SEEK   IMMEDIATE MEDICAL CARE IF:   You have pain that radiates from your back into your legs.  You develop new bowel or bladder control problems.  You have unusual weakness or numbness in your arms or legs.  You develop nausea or vomiting.  You develop abdominal pain.  You feel faint. Document Released: 04/25/2005 Document Revised: 10/25/2011 Document Reviewed: 08/27/2013 Exit[employer] Patient Information 2015 [employer]. This information is not intended to replace advice given to you by your health care provider. Make sure you  discuss any questions you have with your health care provider.  

## 2014-10-04 NOTE — ED Notes (Signed)
Patient here with posterior left shoulder pain x 2 weeks. Denies trauma, reports that he has had some radiation to left rib area, reports intermittent hand and finger numbness with same.

## 2014-10-04 NOTE — ED Provider Notes (Signed)
CSN: [id number]     Arrival date & time 10/04/14  1041 History   First MD Initiated Contact with Patient 10/04/14 1056     Chief Complaint  Patient presents with  . Shoulder Pain     ) HPI  History presents evaluation of left-sided back pain. States he has pain in her shoulder blade and has last 2 weeks. No difficulty breathing or pain with breathing. It is "just below my scapula". He is in the "sign business". From out of lifting and physical activity but no known injury or trauma. Symptoms for the last 2 weeks. He states that a few days ago with some tingling in his hand but not since present have weakness in either upper or lower extremities. 2 past similar episodes, several months ago, and resolve spontaneously.  History reviewed. No pertinent past medical history. Past Surgical History  Procedure Laterality Date  . Unremarkable     Family History  Problem Relation Age of Onset  . Hypertension Father    History  Substance Use Topics  . Smoking status: Current Every Day Smoker -- 1.00 packs/day for 30 years    Types: Cigarettes  . Smokeless tobacco: Not on file  . Alcohol Use: 0.5 - 1.0 oz/week    1-2 drink(s) per week     Comment: occasionally    Review of Systems  Constitutional: Negative for fever, chills, diaphoresis, appetite change and fatigue.  HENT: Negative for mouth sores, sore throat and trouble swallowing.   Eyes: Negative for visual disturbance.  Respiratory: Negative for cough, chest tightness, shortness of breath and wheezing.   Cardiovascular: Negative for chest pain.  Gastrointestinal: Negative for nausea, vomiting, abdominal pain, diarrhea and abdominal distention.  Endocrine: Negative for polydipsia, polyphagia and polyuria.  Genitourinary: Negative for dysuria, frequency and hematuria.  Musculoskeletal: Positive for back pain. Negative for gait problem.  Skin: Negative for color change, pallor and rash.  Neurological: Negative for dizziness, syncope,  light-headedness and headaches.  Hematological: Does not bruise/bleed easily.  Psychiatric/Behavioral: Negative for behavioral problems and confusion.      Allergies  Review of patient's allergies indicates no known allergies.  Home Medications   Prior to Admission medications   Medication Sig Start Date End Date Taking? Authorizing Provider  HYDROcodone-acetaminophen (NORCO/VICODIN) 5-325 MG per tablet Take 1 tablet by mouth every 4 (four) hours as needed. 10/04/14   Rolland Porter, MD  ibuprofen (ADVIL,MOTRIN) 800 MG tablet Take 1 tablet (800 mg total) by mouth 3 (three) times daily. 05/12/14   Glynn Octave, MD  methocarbamol (ROBAXIN) 500 MG tablet Take 1 tablet (500 mg total) by mouth 3 (three) times daily between meals as needed. 10/04/14   Rolland Porter, MD  naproxen (NAPROSYN) 500 MG tablet Take 1 tablet (500 mg total) by mouth 2 (two) times daily. 10/04/14   Rolland Porter, MD   BP 149/92 mmHg  Pulse 56  Temp(Src) 98.3 F (36.8 C) (Oral)  Resp 20  Ht  (1.854 m)  Wt 195 lb (88.451 kg)  BMI 25.73 kg/m2  SpO2 100% Physical Exam  Constitutional: He is oriented to person, place, and time. He appears well-developed and well-nourished. No distress.  HENT:  Head: Normocephalic.  Eyes: Conjunctivae are normal. Pupils are equal, round, and reactive to light. No scleral icterus.  Neck: Normal range of motion. Neck supple. No thyromegaly present.  Cardiovascular: Normal rate and regular rhythm.  Exam reveals no gallop and no friction rub.   No murmur heard. Pulmonary/Chest: Effort normal and  breath sounds normal. No respiratory distress. He has no wheezes. He has no rales.  Abdominal: Soft. Bowel sounds are normal. He exhibits no distension. There is no tenderness. There is no rebound.  Musculoskeletal: Normal range of motion.       Back:  Neurological: He is alert and oriented to person, place, and time.  Normal symmetric Strength to shoulder shrug, triceps, biceps, grip,wrist  flex/extend,and intrinsics  Norma lsymmetric sensation above and below clavicles, and to all distributions to UEs. Norma symmetric strength to flex/.extend hip and knees, dorsi/plantar flex ankles. Normal symmetric sensation to all distributions to LEs Patellar and achilles reflexes 1-2+. Downgoing Babinski   Skin: Skin is warm and dry. No rash noted.  Psychiatric: He has a normal mood and affect. His behavior is normal.    ED Course  Procedures (including critical care time) Labs Review Labs Reviewed - No data to display  Imaging Review Dg Thoracic Spine 2 View  10/04/2014   CLINICAL DATA:  Left shoulder and upper back pain, NKI, no prior surgeries, some numbness and tingling down left arm- pt states it didn't last long  EXAM: THORACIC SPINE - 2 VIEW  COMPARISON:  None.  FINDINGS: There is no evidence of thoracic spine fracture. Alignment is normal. No other significant bone abnormalities are identified.  IMPRESSION: Negative.   Electronically Signed   By: Corlis Leak  Hassell M.D.   On: 10/04/2014 11:26     EKG Interpretation None      MDM   Final diagnoses:  Back pain    Reassuring x-rays. I don't feel that this represents acute herniated nucleus or radiculopathy. Pain is mid thoracic. He has normal upper extremity neurological exam. No signs of compression or other abnormalities on plain films. Plan is an inflammatory, muscle relaxants, pain medication. Primary care follow-up regarding physical therapy or additional diagnostic is not improving.   Rolland PorterMark Micky Overturf, MD 10/04/14 1218

## 2014-12-11 ENCOUNTER — Encounter (HOSPITAL_BASED_OUTPATIENT_CLINIC_OR_DEPARTMENT_OTHER): Payer: Self-pay | Admitting: *Deleted

## 2014-12-11 ENCOUNTER — Emergency Department (HOSPITAL_BASED_OUTPATIENT_CLINIC_OR_DEPARTMENT_OTHER)
Admission: EM | Admit: 2014-12-11 | Discharge: 2014-12-11 | Disposition: A | Discharge status: Home / Self Care | Payer: Self-pay | Attending: Emergency Medicine | Admitting: Emergency Medicine

## 2014-12-11 DIAGNOSIS — Z791 Long term (current) use of non-steroidal anti-inflammatories (NSAID): Secondary | ICD-10-CM | POA: Insufficient documentation

## 2014-12-11 DIAGNOSIS — M5410 Radiculopathy, site unspecified: Secondary | ICD-10-CM

## 2014-12-11 DIAGNOSIS — M5416 Radiculopathy, lumbar region: Secondary | ICD-10-CM | POA: Insufficient documentation

## 2014-12-11 DIAGNOSIS — Z720 Tobacco use: Secondary | ICD-10-CM | POA: Insufficient documentation

## 2014-12-11 MED ORDER — HYDROCODONE-ACETAMINOPHEN 5-325 MG PO TABS: 1.0000 | ORAL_TABLET | ORAL | Status: DC | PRN

## 2014-12-11 MED ORDER — METHOCARBAMOL 500 MG PO TABS: 500.0000 mg | ORAL_TABLET | Freq: Three times a day (TID) | ORAL | Status: DC | PRN

## 2014-12-11 MED ORDER — DEXAMETHASONE SODIUM PHOSPHATE 10 MG/ML IJ SOLN
10.0000 mg | Freq: Once | INTRAMUSCULAR | Status: AC
  Administered 2014-12-11: 10 mg via INTRAMUSCULAR
  Filled 2014-12-11: qty 1

## 2014-12-11 NOTE — ED Provider Notes (Signed)
CSN: [id number]     Arrival date & time 12/11/14  0745 History   First MD Initiated Contact with Patient 12/11/14 [phone]     Chief Complaint  Patient presents with  . Back Pain     (Consider location/radiation/quality/duration/timing/severity/associated sxs/prior Treatment) HPI Comments: Patient presents with low back pain. He states it's been hurting for about 2 weeks but since yesterday it's gotten markedly worse. He has pain in his right lower back that radiates down his right leg. He took a Herbalist last night without relief. He he has been taking ibuprofen with some relief in symptoms. He denies any numbness or weakness in his legs. He denies any loss of bowel or bladder control. He denies abdominal pain. There is no fevers or chills. He denies any urinary symptoms. He's had some prior history of sciatica-like back pain on the left side. He works installing heavy signs.  Patient is a 44 y.o. male presenting with back pain.  Back Pain Associated symptoms: no abdominal pain, no chest pain, no fever, no headaches, no numbness and no weakness     History reviewed. No pertinent past medical history. Past Surgical History  Procedure Laterality Date  . Unremarkable     Family History  Problem Relation Age of Onset  . Hypertension Father    History  Substance Use Topics  . Smoking status: Current Every Day Smoker -- 1.00 packs/day for 30 years    Types: Cigarettes  . Smokeless tobacco: Not on file  . Alcohol Use: 0.5 - 1.0 oz/week    1-2 drink(s) per week     Comment: occasionally    Review of Systems  Constitutional: Negative for fever, chills, diaphoresis and fatigue.  HENT: Negative for congestion, rhinorrhea and sneezing.   Eyes: Negative.   Respiratory: Negative for cough, chest tightness and shortness of breath.   Cardiovascular: Negative for chest pain and leg swelling.  Gastrointestinal: Negative for nausea, vomiting, abdominal pain, diarrhea and blood in stool.   Genitourinary: Negative for frequency, hematuria, flank pain and difficulty urinating.  Musculoskeletal: Positive for back pain. Negative for arthralgias.  Skin: Negative for rash.  Neurological: Negative for dizziness, speech difficulty, weakness, numbness and headaches.      Allergies  Review of patient's allergies indicates no known allergies.  Home Medications   Prior to Admission medications   Medication Sig Start Date End Date Taking? Authorizing Provider  HYDROcodone-acetaminophen (NORCO/VICODIN) 5-325 MG per tablet Take 1 tablet by mouth every 4 (four) hours as needed. 12/11/14   Rolan Bucco, MD  ibuprofen (ADVIL,MOTRIN) 800 MG tablet Take 1 tablet (800 mg total) by mouth 3 (three) times daily. 05/12/14   Glynn Octave, MD  methocarbamol (ROBAXIN) 500 MG tablet Take 1 tablet (500 mg total) by mouth 3 (three) times daily between meals as needed. 12/11/14   Rolan Bucco, MD  naproxen (NAPROSYN) 500 MG tablet Take 1 tablet (500 mg total) by mouth 2 (two) times daily. 10/04/14   Rolland Porter, MD   BP 130/87 mmHg  Pulse 64  Temp(Src) 97.8 F (36.6 C) (Oral)  Resp 16  Ht 6' (1.829 m)  Wt 195 lb (88.451 kg)  BMI 26.44 kg/m2  SpO2 100% Physical Exam  Constitutional: He is oriented to person, place, and time. He appears well-developed and well-nourished.  HENT:  Head: Normocephalic and atraumatic.  Eyes: Pupils are equal, round, and reactive to light.  Neck: Normal range of motion. Neck supple.  Cardiovascular: Normal rate, regular rhythm and normal heart sounds.  Pulmonary/Chest: Effort normal and breath sounds normal. No respiratory distress. He has no wheezes. He has no rales. He exhibits no tenderness.  Abdominal: Soft. Bowel sounds are normal. There is no tenderness. There is no rebound and no guarding.  Musculoskeletal: Normal range of motion. He exhibits no edema.  Positive tenderness in the right lower lumbar area. There is no spinal tenderness. Positive straight leg  raise on the right. Patellar reflexes symmetric. He has normal motor function and sensation in the legs. Pedal pulses are intact.  Lymphadenopathy:    He has no cervical adenopathy.  Neurological: He is alert and oriented to person, place, and time.  Skin: Skin is warm and dry. No rash noted.  Psychiatric: He has a normal mood and affect.    ED Course  Procedures (including critical care time) Labs Review Labs Reviewed - No data to display  Imaging Review No results found.   EKG Interpretation None      MDM   Final diagnoses:  Radicular low back pain    Patient with radicular low back pain. He has no neurologic deficits or signs of cauda equina. He was given a shot of Decadron in the ED. He was given prescriptions for Vicodin and Robaxin. He was given a referral to follow-up with the neurosurgeon. His wife has been to the neurosurgery group and [facility] and request this referral versus their primary care physician. He likely will need an MRI if his symptoms are not improving.    Rolan Bucco, MD 12/11/14 [phone]

## 2014-12-11 NOTE — Discharge Instructions (Signed)

## 2014-12-11 NOTE — ED Notes (Signed)
Patient c/o back pain that radiates  R leg, took a soma last night but no relief

## 2015-04-30 ENCOUNTER — Encounter (HOSPITAL_BASED_OUTPATIENT_CLINIC_OR_DEPARTMENT_OTHER): Payer: Self-pay

## 2015-04-30 ENCOUNTER — Emergency Department (HOSPITAL_BASED_OUTPATIENT_CLINIC_OR_DEPARTMENT_OTHER)
Admission: EM | Admit: 2015-04-30 | Discharge: 2015-04-30 | Disposition: A | Discharge status: Home / Self Care | Payer: Self-pay | Attending: Emergency Medicine | Admitting: Emergency Medicine

## 2015-04-30 DIAGNOSIS — H9202 Otalgia, left ear: Secondary | ICD-10-CM | POA: Insufficient documentation

## 2015-04-30 DIAGNOSIS — J029 Acute pharyngitis, unspecified: Secondary | ICD-10-CM

## 2015-04-30 DIAGNOSIS — F17210 Nicotine dependence, cigarettes, uncomplicated: Secondary | ICD-10-CM | POA: Insufficient documentation

## 2015-04-30 LAB — RAPID STREP SCREEN (MED CTR MEBANE ONLY): Streptococcus, Group A Screen (Direct): NEGATIVE

## 2015-04-30 MED ORDER — HYDROCODONE-ACETAMINOPHEN 7.5-325 MG/15ML PO SOLN: 10.0000 mL | Freq: Two times a day (BID) | ORAL | Status: AC | PRN

## 2015-04-30 MED ORDER — GUAIFENESIN 100 MG/5ML PO LIQD: 100.0000 mg | ORAL | Status: AC | PRN

## 2015-04-30 NOTE — ED Provider Notes (Signed)
CSN: [id number]     Arrival date & time 04/30/15  1529 History   First MD Initiated Contact with Patient 04/30/15 1600     Chief Complaint  Patient presents with  . URI     (Consider location/radiation/quality/duration/timing/severity/associated sxs/prior Treatment) HPI  PCP: Lemont Fillers., NP PMH: none  Dwayne Jordan is a 44 y.o.  male  CHIEF COMPLAINT: sore throat and left ear pain  Time of onset: a few days ago        Quality:  sharp        Severity:  Mild/mod        Progression:  worsening Chronicity: acute Risk factors: none Treatments tried:  None tried at home Relieved by: Not eating Worsened by: eating and swallowing Associated Symptoms:  Right ear pain and sneezing  ROS: The patient denies diaphoresis, fever, headache, weakness (general or focal), confusion, change of vision,  dysphagia, aphagia, shortness of breath,  abdominal pains, nausea, vomiting, diarrhea, lower extremity swelling, rash, neck pain, chest pain   History reviewed. No pertinent past medical history. Past Surgical History  Procedure Laterality Date  . Unremarkable     Family History  Problem Relation Age of Onset  . Hypertension Father    Social History  Substance Use Topics  . Smoking status: Current Every Day Smoker -- 1.00 packs/day for 30 years    Types: Cigarettes  . Smokeless tobacco: None  . Alcohol Use: Yes     Comment: occasionally    Review of Systems  Review of Systems All other systems negative except as documented in the HPI. All pertinent positives and negatives as reviewed in the HPI.   Allergies  Review of patient's allergies indicates no known allergies.  Home Medications   Prior to Admission medications   Medication Sig Start Date End Date Taking? Authorizing Provider  guaiFENesin (ROBITUSSIN) 100 MG/5ML liquid Take 5-10 mLs (100-200 mg total) by mouth every 4 (four) hours as needed for cough. 04/30/15   Gibran Veselka Neva Seat, PA-C   HYDROcodone-acetaminophen (HYCET) 7.5-325 mg/15 ml solution Take 10 mLs by mouth 2 (two) times daily as needed for moderate pain. 04/30/15 04/29/16  Aerabella Galasso Neva Seat, PA-C   BP 126/94 mmHg  Pulse 74  Temp(Src) 98.5 F (36.9 C) (Oral)  Resp 18  Ht  (1.854 m)  Wt 88.361 kg  BMI 25.71 kg/m2  SpO2 96% Physical Exam  Constitutional: He is oriented to person, place, and time. He appears well-developed and well-nourished. No distress.  HENT:  Head: Normocephalic and atraumatic.  Right Ear: Tympanic membrane, external ear and ear canal normal.  Left Ear: Tympanic membrane, external ear and ear canal normal.  Nose: Nose normal. No rhinorrhea. Right sinus exhibits no maxillary sinus tenderness and no frontal sinus tenderness. Left sinus exhibits no maxillary sinus tenderness and no frontal sinus tenderness.  Mouth/Throat: Uvula is midline and mucous membranes are normal. No trismus in the jaw. Normal dentition. No dental abscesses or uvula swelling. Posterior oropharyngeal erythema present. No oropharyngeal exudate, posterior oropharyngeal edema or tonsillar abscesses.  No submental edema, tongue not elevated, no trismus. No impending airway obstruction; Pt able to speak full sentences, swallow intact, no drooling, stridor, or tonsillar/uvula displacement. No palatal petechia  Eyes: Conjunctivae are normal.  Neck: Trachea normal, normal range of motion and full passive range of motion without pain. Neck supple. No rigidity. Normal range of motion present. No Brudzinski's sign noted.  Flexion and extension of neck without pain or difficulty. Able to breath without difficulty  in extension.  Cardiovascular: Normal rate and regular rhythm.   Pulmonary/Chest: Effort normal and breath sounds normal. No stridor. No respiratory distress. He has no wheezes.  Abdominal: Soft. There is no tenderness.  No obvious evidence of splenomegaly. Non ttp.   Musculoskeletal: Normal range of motion.   Lymphadenopathy:       Head (right side): No preauricular and no posterior auricular adenopathy present.       Head (left side): No preauricular and no posterior auricular adenopathy present.    He has cervical adenopathy.  Neurological: He is alert and oriented to person, place, and time.  Skin: Skin is warm and dry. No rash noted. He is not diaphoretic.  Psychiatric: He has a normal mood and affect.  Nursing note and vitals reviewed.   ED Course  Procedures (including critical care time) Labs Review Labs Reviewed  RAPID STREP SCREEN (NOT [facility])  CULTURE, GROUP A STREP    Imaging Review No results found. I have personally reviewed and evaluated these images and lab results as part of my medical decision-making.   EKG Interpretation None      MDM   Final diagnoses:  Pharyngitis    Negative strep screen. Will treat as viral illness with decongestant and pain medications.  Take antibiotic in completion. Continue to stay well-hydrated. Gargle warm salt water and spit it out. Continued to alternate between Tylenol and ibuprofen for pain. May consider over-the-counter Benadryl for additional relief. Followup with your primary care doctor in 5-7 days for recheck of ongoing symptoms that return to emergency department for emergent changing or worsening of symptoms.   Medications - No data to display   I feel the patient has had an appropriate workup for their chief complaint at this time and likelihood of emergent condition existing is low. Discussed s/sx that warrant return to the ED.  Filed Vitals:   04/30/15 1537  BP: 126/94  Pulse: 74  Temp: 98.5 F (36.9 C)  Resp: [address]      Ralston Venus, PA-C 04/30/15 1720  Rolland PorterMark James, MD 05/12/15 [phone]

## 2015-04-30 NOTE — ED Notes (Signed)
C/o left ear ache and sore throat-NAD

## 2015-04-30 NOTE — Discharge Instructions (Signed)

## 2015-05-02 LAB — CULTURE, GROUP A STREP

## 2015-05-08 ENCOUNTER — Ambulatory Visit (INDEPENDENT_AMBULATORY_CARE_PROVIDER_SITE_OTHER): Payer: Self-pay | Admitting: Physician Assistant

## 2015-05-08 VITALS — BP 116/82 | HR 67 | Temp 98.4°F | Resp 16 | Ht 72.0 in | Wt 197.0 lb

## 2015-05-08 DIAGNOSIS — Z0289 Encounter for other administrative examinations: Secondary | ICD-10-CM

## 2015-05-08 NOTE — Progress Notes (Signed)
[facility] [address], [place] [id number] [phone]  Date:  05/08/2015   Name:  Dwayne HartshornBobby D Jordan   DOB:  [birth year]   MRN:  [id number]  PCP:  Lemont Fillers'SULLIVAN,MELISSA S., NP    History of Present Illness:  Dwayne HartshornBobby D Jordan is a 44 y.o. male patient who [facility]     Patient Active Problem List   Diagnosis Date Noted  . Otitis media 04/19/2013  . Plantar fasciitis, bilateral 11/05/2012  . 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 Testicular pain 01/21/2012  . Elbow pain 01/21/2012    History reviewed. No pertinent past medical history.  Past Surgical History  Procedure Laterality Date  . Unremarkable      Social History  Substance Use Topics  . Smoking status: Current Every Day Smoker -- 1.00 packs/day for 30 years    Types: Cigarettes  . Smokeless tobacco: None  . Alcohol Use: Yes     Comment: occasionally    Family History  Problem Relation Age of Onset  . Hypertension Father     No Known Allergies  Medication list has been reviewed and updated.  Current Outpatient Prescriptions on File Prior to Visit  Medication Sig Dispense Refill  . guaiFENesin (ROBITUSSIN) 100 MG/5ML liquid Take 5-10 mLs (100-200 mg total) by mouth every 4 (four) hours as needed for cough. 60 mL 0  . HYDROcodone-acetaminophen (HYCET) 7.5-325 mg/15 ml solution Take 10 mLs by mouth 2 (two) times daily as needed for moderate pain. 60 mL 0   No current facility-administered medications on file prior to visit.    ROS   Physical Examination: BP 116/82 mmHg  Pulse 67  Temp(Src) 98.4 F (36.9 C)  Resp 16  Ht 6' (1.829 m)  Wt 197 lb (89.359 kg)  BMI 26.71 kg/m2  SpO2 98% Ideal Body Weight: Weight in (lb) to have BMI = 25: 183.9  Physical Exam   Assessment and Plan: Dwayne HartshornBobby D Jordan is a 44 y.o. male who is here today  There are no diagnoses linked to this encounter.  Trena PlattStephanie English, PA-C [facility] 05/08/2015 10:21 AM

## 2015-05-08 NOTE — Progress Notes (Signed)
Commercial Driver Medical Examination   Dwayne HartshornBobby D Jordan is a 44 y.o. male who presents today for a commercial driver fitness determination physical exam. The patient reports no problems. The following portions of the patient's history were reviewed and updated as appropriate: allergies, current medications, past family history, past medical history, past social history, past surgical history and problem list. Review of Systems A comprehensive review of systems was negative.   Objective:    Vision: Applicant can recognize and distinguish among traffic control signals and devices showing standard red, green, and amber colors.  none  Monocular Vision?: No     Visual Acuity Screening   Right eye Left eye Both eyes  Without correction: 20/15 20/13 20/13   With correction:     Comments: The patient can distinguish the colors red, amber and green.  Peripheral Vision: Right eye 70 degrees. Left eye 70 degrees.  Hearing Screening Comments: The patient was able to hear a forced whisper from 10 feet.   Hearing:   BP 116/82 mmHg  Pulse 67  Temp(Src) 98.4 F (36.9 C)  Resp 16  Ht 6' (1.829 m)  Wt 197 lb (89.359 kg)  BMI 26.71 kg/m2  SpO2 98%  General Appearance:    Alert, cooperative, no distress, appears stated age  Head:    Normocephalic, without obvious abnormality, atraumatic  Eyes:    PERRL, conjunctiva/corneas clear, EOM's intact, fundi    benign, both eyes       Ears:    Normal TM's and external ear canals, both ears  Nose:   Nares normal, septum midline, mucosa normal, no drainage    or sinus tenderness  Throat:   Lips, mucosa, and tongue normal; teeth and gums normal  Neck:   Supple, symmetrical, trachea midline, no adenopathy;       thyroid:  No enlargement/tenderness/nodules; no carotid   bruit or JVD  Back:     Symmetric, no curvature, ROM normal, no CVA tenderness  Lungs:     Clear to auscultation bilaterally, respirations unlabored  Chest wall:    No tenderness  or deformity  Heart:    Regular rate and rhythm, S1 and S2 normal, no murmur, rub   or gallop  Abdomen:     Soft, non-tender, bowel sounds active all four quadrants,    no masses, no organomegaly  Genitalia:    Normal male without lesion, discharge or tenderness     Extremities:   Extremities normal, atraumatic, no cyanosis or edema  Pulses:   2+ and symmetric all extremities  Skin:   Skin color, texture, turgor normal, no rashes or lesions  Lymph nodes:   Cervical, supraclavicular, and axillary nodes normal  Neurologic:   CNII-XII intact. Normal strength, sensation and reflexes      throughout    Labs: Lab Results  Component Value Date   PROTEINUR NEGATIVE 05/12/2014   BILIRUBINUR NEGATIVE 05/12/2014   GLUCOSEU NEGATIVE 05/12/2014     UA: negative throughout     Plan:    Medical examiners certificate completed and printed. Return as needed.

## 2015-06-05 ENCOUNTER — Telehealth: Payer: Self-pay | Admitting: Family

## 2015-06-05 NOTE — Telephone Encounter (Signed)
LM to schedule annual exam (last visit with Melissa 10/2012) and get info on flu shot.

## 2015-09-29 ENCOUNTER — Emergency Department (HOSPITAL_BASED_OUTPATIENT_CLINIC_OR_DEPARTMENT_OTHER)
Admission: EM | Admit: 2015-09-29 | Discharge: 2015-09-29 | Disposition: A | Discharge status: Home / Self Care | Payer: Self-pay | Attending: Emergency Medicine | Admitting: Emergency Medicine

## 2015-09-29 ENCOUNTER — Encounter (HOSPITAL_BASED_OUTPATIENT_CLINIC_OR_DEPARTMENT_OTHER): Payer: Self-pay | Admitting: *Deleted

## 2015-09-29 DIAGNOSIS — Y929 Unspecified place or not applicable: Secondary | ICD-10-CM | POA: Insufficient documentation

## 2015-09-29 DIAGNOSIS — H579 Unspecified disorder of eye and adnexa: Secondary | ICD-10-CM

## 2015-09-29 DIAGNOSIS — Y9389 Activity, other specified: Secondary | ICD-10-CM | POA: Insufficient documentation

## 2015-09-29 DIAGNOSIS — Y999 Unspecified external cause status: Secondary | ICD-10-CM | POA: Insufficient documentation

## 2015-09-29 DIAGNOSIS — S00252A Superficial foreign body of left eyelid and periocular area, initial encounter: Secondary | ICD-10-CM | POA: Insufficient documentation

## 2015-09-29 DIAGNOSIS — X58XXXA Exposure to other specified factors, initial encounter: Secondary | ICD-10-CM | POA: Insufficient documentation

## 2015-09-29 DIAGNOSIS — Z87891 Personal history of nicotine dependence: Secondary | ICD-10-CM | POA: Insufficient documentation

## 2015-09-29 MED ORDER — FLUORESCEIN SODIUM 1 MG OP STRP
ORAL_STRIP | OPHTHALMIC | Status: AC
  Administered 2015-09-29: 1 via OPHTHALMIC
  Filled 2015-09-29: qty 1

## 2015-09-29 MED ORDER — TETRACAINE HCL 0.5 % OP SOLN: 2.0000 [drp] | Freq: Once | OPHTHALMIC | Status: AC

## 2015-09-29 MED ORDER — FLUORESCEIN SODIUM 1 MG OP STRP: 1.0000 | ORAL_STRIP | Freq: Once | OPHTHALMIC | Status: AC

## 2015-09-29 MED ORDER — TETRACAINE HCL 0.5 % OP SOLN
OPHTHALMIC | Status: AC
  Administered 2015-09-29: 2 [drp] via OPHTHALMIC
  Filled 2015-09-29: qty 4

## 2015-09-29 NOTE — Discharge Instructions (Signed)
Eye Foreign Body  A foreign body refers to any object on the surface of the eye or in the eyeball that should not be there. A foreign body may be a small speck of dirt or dust, a hair or eyelash, a splinter, or any other object.   SIGNS AND SYMPTOMS  Symptoms depend on what the foreign body is and where it is in the eye. The most common locations are:    On the inner surface of the upper or lower eyelids or on the covering of the white part of the eye (conjunctiva). Symptoms in this location are:    Pain and irritation, especially when blinking.    The feeling that something is in the eye.   On the surface of the clear covering on the front of the eye (cornea). Symptoms in this location include:    Pain and irritation.     Small "rust rings" around a metallic foreign body.    The feeling that something is in the eye.    Inside the eyeball. Foreign bodies inside the eye may cause:     Great pain.     Immediate loss of vision.     Distortion of the pupil.  DIAGNOSIS   Foreign bodies are found during an exam by an eye specialist. Those on the eyelids, conjunctiva, or cornea are usually (but not always) easily found. When a foreign body is inside the eyeball, a cloudiness of the lens (cataract) may form almost right away. This makes it hard for an eye specialist to find the foreign body. Tests may be needed, including ultrasound testing, X-rays, and CT scans.  TREATMENT    Foreign bodies on the eyelids, conjunctiva, or cornea are often removed easily and painlessly.   Rust in the cornea may require the use of a drill-like instrument to remove the rust.   If the foreign body has caused a scratch or a rubbing or scraping (abrasion) of the cornea, this may be treated with antibiotic drops or ointment. A pressure patch may be put over your eye.   If the foreign body is inside your eyeball, surgery is needed right away. This is a medical emergency. Foreign bodies inside the eye threaten vision. A person may even  lose his or her eye.  HOME CARE INSTRUCTIONS    Take medicines only as directed by your health care provider. Use eye drops or ointment as directed.   If no eye patch was applied:    Keep your eye closed as much as possible.    Do not rub your eye.    Wear dark glasses as needed to protect your eyes from bright light.    Do not wear contact lenses until your eye feels normal again, or as instructed by your health care provider.    Wear a protective eye covering if there is a risk of eye injury. This is important when working with high-speed tools.   If your eye is patched:    Follow your health care provider's instructions for when to remove the patch.    Do notdrive or operate machinery if your eye is patched. Your ability to judge distances is impaired.   Keep all follow-up visits as directed by your health care provider. This is important.  SEEK MEDICAL CARE IF:    You have increased pain in your eye.   Your vision gets worse.    You have problems with your eye patch.    You have fluid (discharge)   coming from your injured eye.    You have redness and swelling around your affected eye.   MAKE SURE YOU:    Understand these instructions.   Will watch your condition.   Will get help right away if you are not doing well or get worse.     This information is not intended to replace advice given to you by your health care provider. Make sure you discuss any questions you have with your health care provider.     Document Released: 04/25/2005 Document Revised: 05/16/2014 Document Reviewed: 09/20/2012  Elsevier Interactive Patient Education 2016 [employer]

## 2015-09-29 NOTE — ED Notes (Signed)
Patient c/o left eye irritation since yesterday. 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

## 2015-09-29 NOTE — ED Provider Notes (Signed)
CSN: [id number]     Arrival date & time 09/29/15  1014 History   First MD Initiated Contact with Patient 09/29/15 1025     Chief Complaint  Patient presents with  . Foreign Body in Eye     (Consider location/radiation/quality/duration/timing/severity/associated sxs/prior Treatment) Patient is a 45 y.o. male presenting with foreign body in eye. The history is provided by the patient.  Foreign Body in Eye This is a new problem. The problem occurs constantly. The problem has not changed since onset.Nothing aggravates the symptoms. Nothing relieves the symptoms. Treatments tried: running under sink. The treatment provided no relief.    History reviewed. No pertinent past medical history. Past Surgical History  Procedure Laterality Date  . Unremarkable     Family History  Problem Relation Age of Onset  . Hypertension Father    Social History  Substance Use Topics  . Smoking status: Former Smoker -- 1.00 packs/day for 30 years    Types: Cigarettes  . Smokeless tobacco: None  . Alcohol Use: Yes     Comment: occasionally    Review of Systems  All other systems reviewed and are negative.     Allergies  Review of patient's allergies indicates no known allergies.  Home Medications   Prior to Admission medications   Medication Sig Start Date End Date Taking? Authorizing Provider  guaiFENesin (ROBITUSSIN) 100 MG/5ML liquid Take 5-10 mLs (100-200 mg total) by mouth every 4 (four) hours as needed for cough. 04/30/15   Tiffany Neva SeatGreene, PA-C  HYDROcodone-acetaminophen (HYCET) 7.5-325 mg/15 ml solution Take 10 mLs by mouth 2 (two) times daily as needed for moderate pain. 04/30/15 04/29/16  Tiffany Neva SeatGreene, PA-C   BP 130/95 mmHg  Pulse 50  Temp(Src) 98.8 F (37.1 C) (Oral)  Resp 18  Ht 6' (1.829 m)  Wt 195 lb (88.451 kg)  BMI 26.44 kg/m2  SpO2 99% Physical Exam  Constitutional: He is oriented to person, place, and time. He appears well-developed and well-nourished. No distress.   HENT:  Head: Normocephalic and atraumatic.  Eyes: Left eye exhibits no chemosis and no discharge. No foreign body present in the left eye. Left conjunctiva is injected. Left conjunctiva has no hemorrhage.  Slit lamp exam:      The left eye shows no corneal abrasion, no foreign body and no fluorescein uptake.  Neck: Neck supple. No tracheal deviation present.  Cardiovascular: Normal rate and regular rhythm.   Pulmonary/Chest: Effort normal. No respiratory distress.  Abdominal: Soft. He exhibits no distension.  Neurological: He is alert and oriented to person, place, and time.  Skin: Skin is warm and dry.  Psychiatric: He has a normal mood and affect.    ED Course  Procedures (including critical care time) Labs Review Labs Reviewed - No data to display  Imaging Review No results found. I have personally reviewed and evaluated these images and lab results as part of my medical decision-making.   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 He went to sleep for the night but still feels a left upper foreign body sensation. No corneal abrasion was staining or other signs of injury. Patient's eye was irrigated and foreign body sensation remains with none identified after lid eversion. Patient advised on overlapping lid technique and irrigation to help relieve symptoms but  likely he sustained a small subconjunctival injury in the sensation should resolve within a few days.   Lyndal Pulley, MD 09/29/15 1049

## 2016-01-29 IMAGING — DX DG THORACIC SPINE 2V
3 series · 3 of 3 positions shown · non-contrast
Comparison: None.

CLINICAL DATA: Left shoulder and upper back pain, NKI, no prior
surgeries, some numbness and tingling down left arm- pt states it
didn't last long

EXAM:
THORACIC SPINE - 2 VIEW

[t-spine ap]
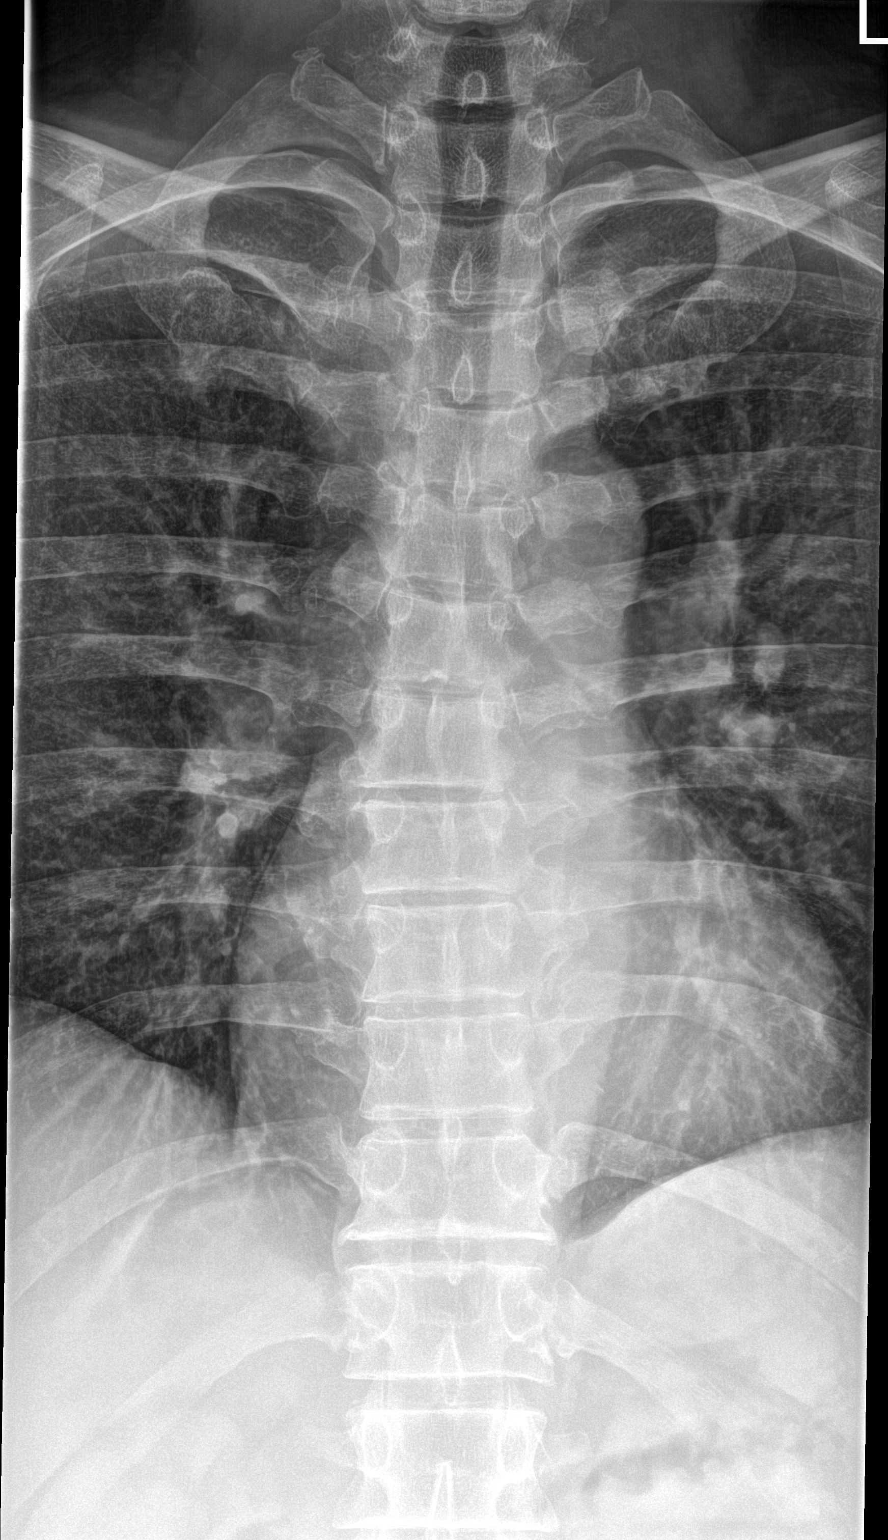

[t-spine lat]
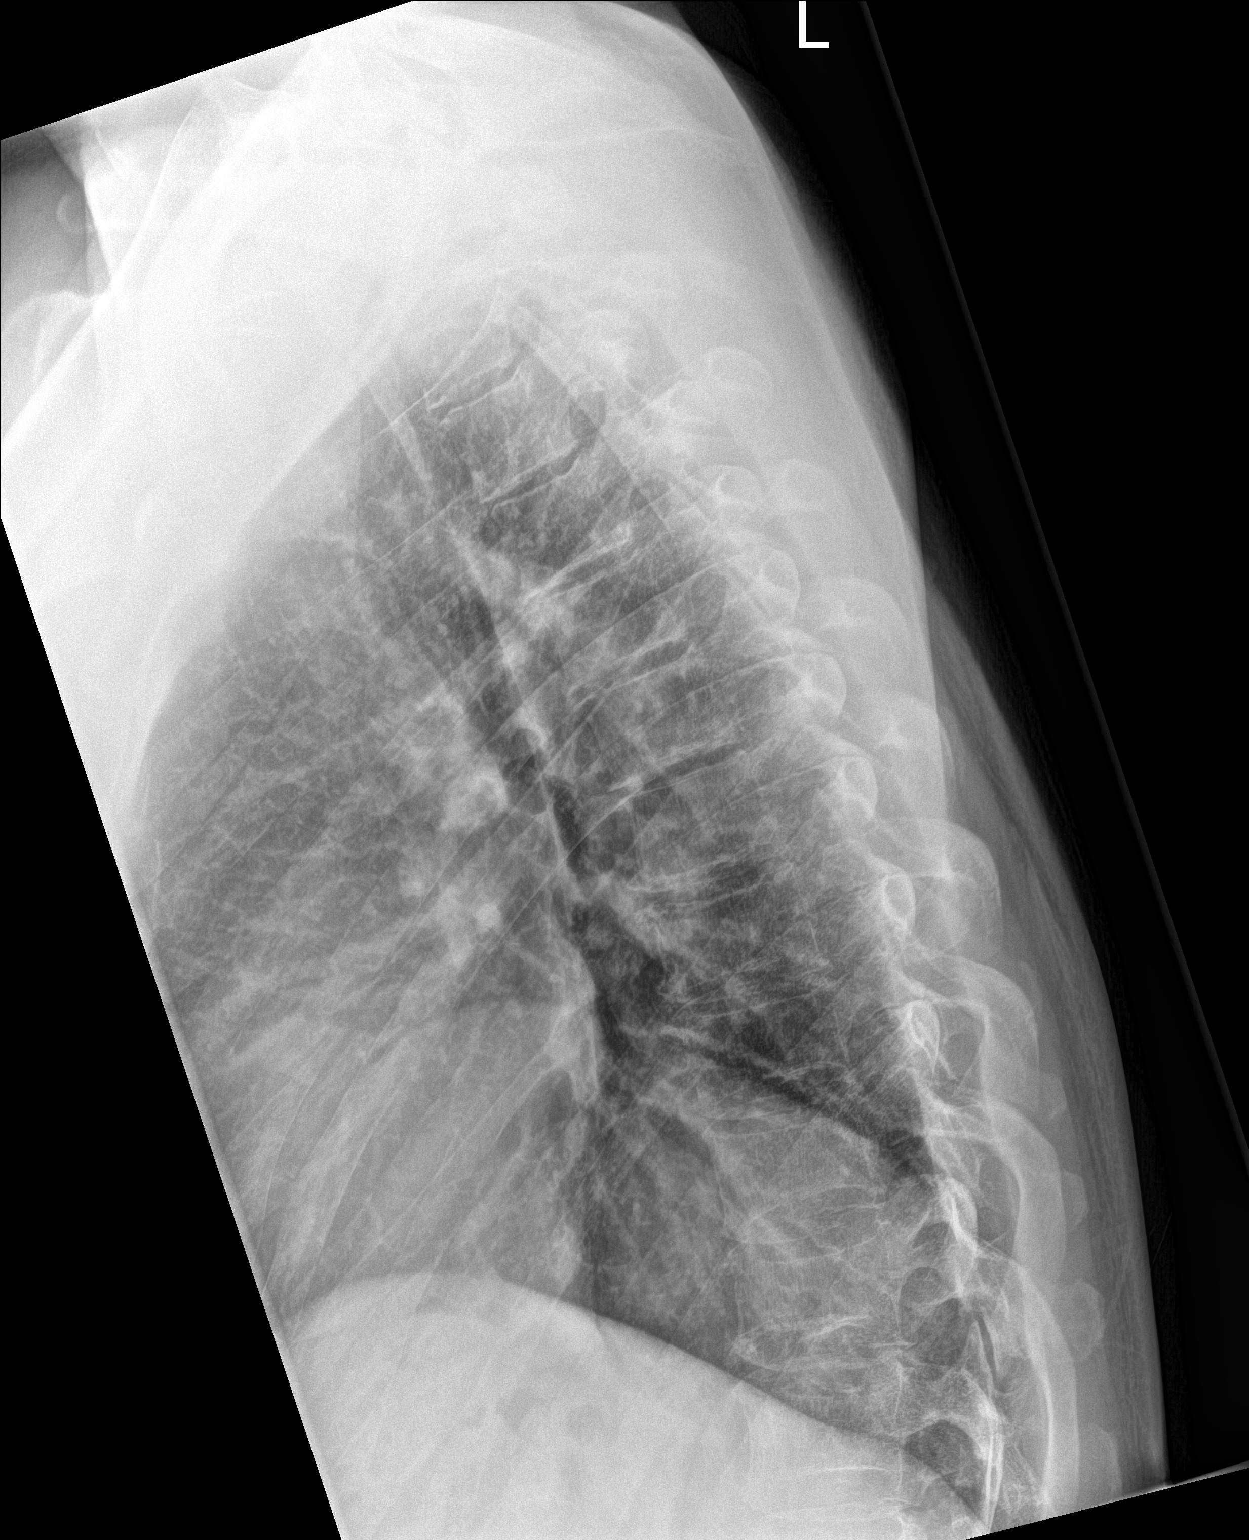

[t-spine swimmers]
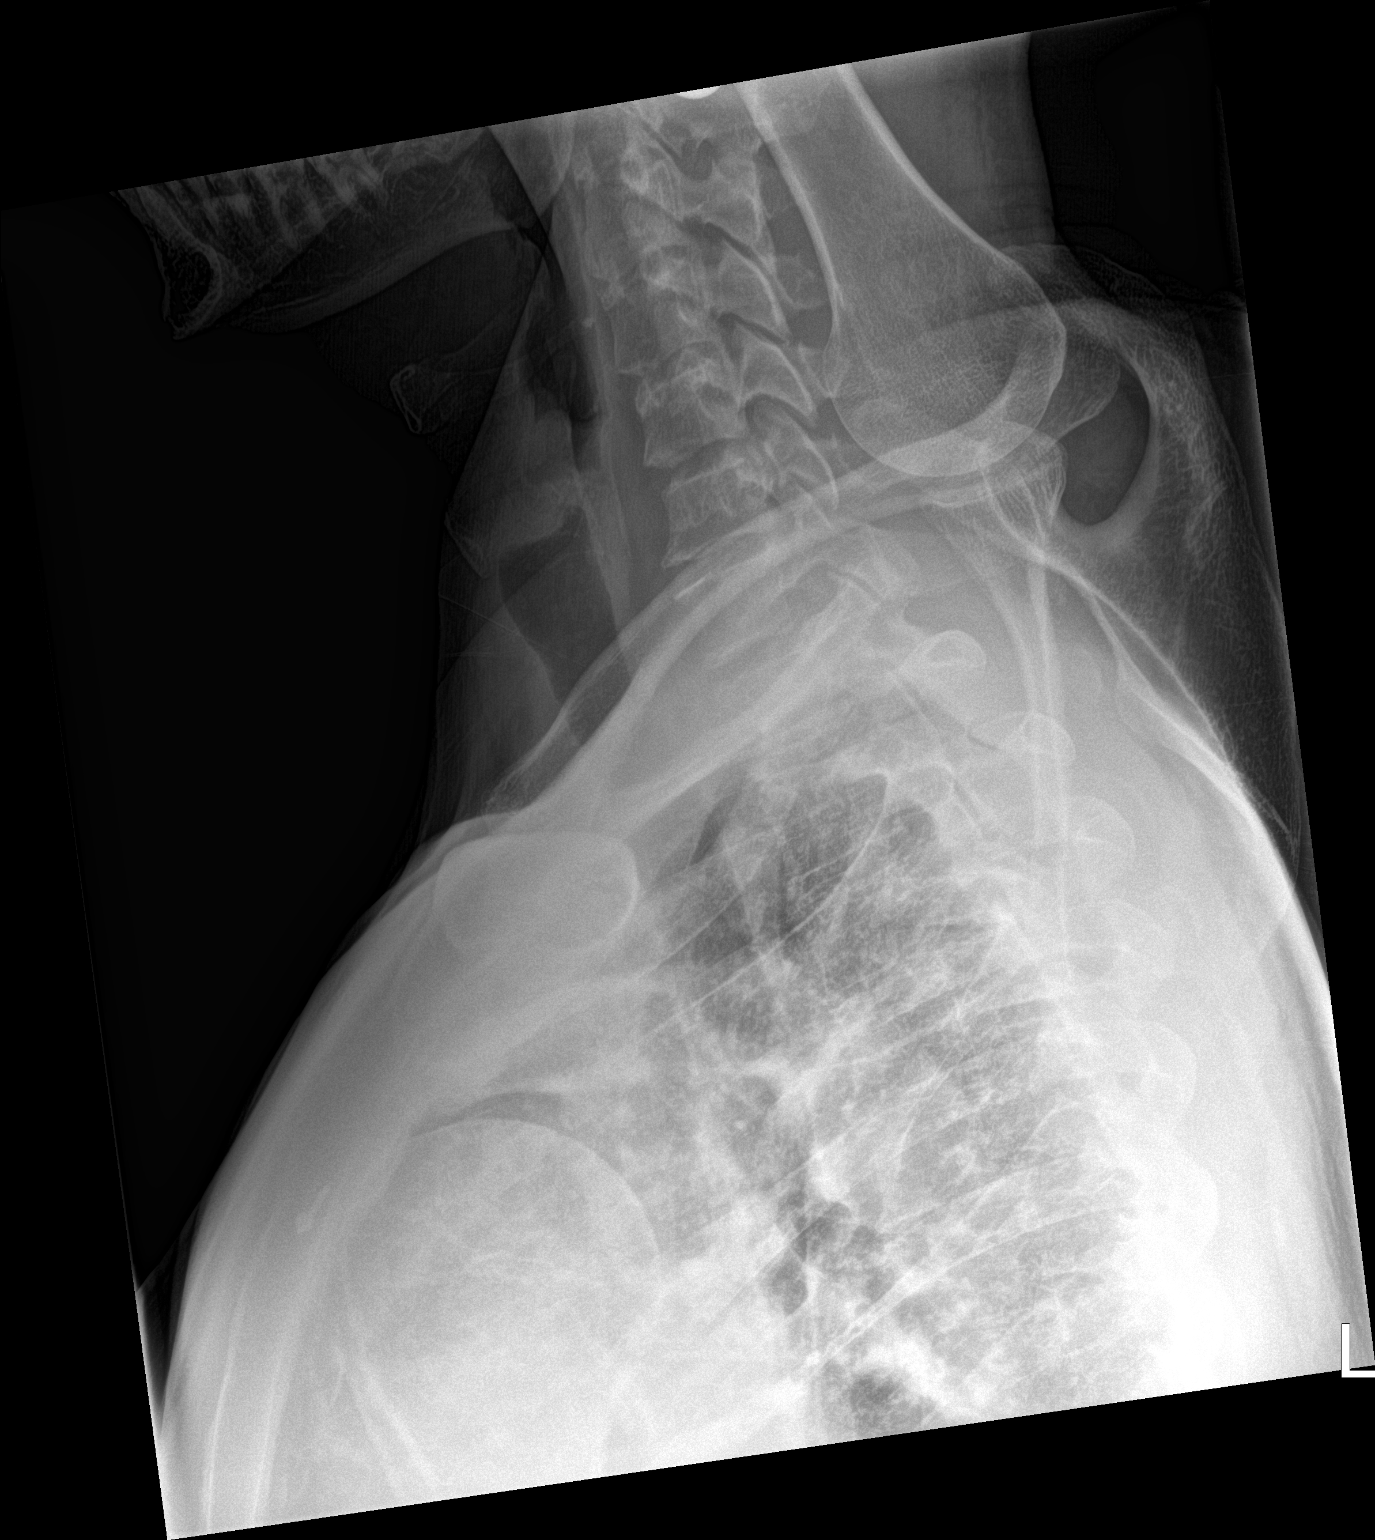

[3 of 3 positions shown; findings below may reference images not displayed]

FINDINGS: There is no evidence of thoracic spine fracture. Alignment is
normal. No other significant bone abnormalities are identified.
IMPRESSION: Negative.

## 2017-04-20 ENCOUNTER — Ambulatory Visit: Payer: Self-pay | Admitting: Physician Assistant

## 2017-05-27 ENCOUNTER — Encounter (HOSPITAL_COMMUNITY): Payer: Self-pay | Admitting: Nurse Practitioner

## 2017-05-27 ENCOUNTER — Emergency Department (HOSPITAL_COMMUNITY)
Admission: EM | Admit: 2017-05-27 | Discharge: 2017-05-27 | Disposition: A | Discharge status: Home / Self Care | Payer: Self-pay | Attending: Emergency Medicine | Admitting: Emergency Medicine

## 2017-05-27 ENCOUNTER — Emergency Department (HOSPITAL_COMMUNITY): Payer: Self-pay

## 2017-05-27 DIAGNOSIS — L03314 Cellulitis of groin: Secondary | ICD-10-CM | POA: Insufficient documentation

## 2017-05-27 DIAGNOSIS — Z79899 Other long term (current) drug therapy: Secondary | ICD-10-CM | POA: Insufficient documentation

## 2017-05-27 DIAGNOSIS — R1909 Other intra-abdominal and pelvic swelling, mass and lump: Secondary | ICD-10-CM

## 2017-05-27 DIAGNOSIS — Z87891 Personal history of nicotine dependence: Secondary | ICD-10-CM | POA: Insufficient documentation

## 2017-05-27 DIAGNOSIS — L03818 Cellulitis of other sites: Secondary | ICD-10-CM

## 2017-05-27 LAB — URINALYSIS, ROUTINE W REFLEX MICROSCOPIC
BILIRUBIN URINE: NEGATIVE
Glucose, UA: NEGATIVE mg/dL
Hgb urine dipstick: NEGATIVE
KETONES UR: NEGATIVE mg/dL
Leukocytes, UA: NEGATIVE
Nitrite: NEGATIVE
PH: 5 (ref 5.0–8.0)
Protein, ur: NEGATIVE mg/dL
Specific Gravity, Urine: 1.025 (ref 1.005–1.030)

## 2017-05-27 MED ORDER — SULFAMETHOXAZOLE-TRIMETHOPRIM 800-160 MG PO TABS: 1.0000 | ORAL_TABLET | Freq: Two times a day (BID) | ORAL | 0 refills | Status: AC

## 2017-05-27 NOTE — ED Triage Notes (Signed)
Pt is c/o left sided groin swelling/knot. Onset of 2 days ago, localized from base of penis and proximal to the left inguinal area per pt. Denies any other sx.

## 2017-05-27 NOTE — Discharge Instructions (Signed)
Take antibiotics as prescribed.  Take the entire course of antibiotics, even if your symptoms improve. Use Tylenol or ibuprofen as needed for pain.  You may use ice to help with pain and swelling. Follow-up with your primary care doctor early next week for reevaluation of your symptoms. Follow up with your primary care doctor or the emergency room if symptoms worsen, you develop fevers, chills, nausea, vomiting, or any new or concerning symptoms.

## 2017-05-27 NOTE — ED Provider Notes (Signed)
[facility]-EMERGENCY DEPT Provider Note   CSN: [id number] Arrival date & time: 05/27/17  1442     History   Chief Complaint Chief Complaint  Patient presents with  . Groin Swelling    Left    HPI Bridget HartshornBobby D Depaolis is a 47 y.o. male presenting with left groin swelling and tenderness.  Patient states for the past several days he has had swelling at the left side base of the shaft of his penis.  Last night, he had some swelling extending to his left groin which was tender.  Pain is mild and described as an ache.  Nothing makes it better or worse.  It is not worse with urination or coughing.  He denies history of similar.  He denies urinary symptoms including dysuria, hematuria, or urinary frequency.  He denies drainage from the penis and denies concerns for STD.  He denies scrotal/testicular pain or swelling.  He denies fevers, chills, chest pain, shortness of breath, nausea, vomiting, abdominal pain, or abnormal bowel movements.  Patient has a history of possible epididymitis for 5 years ago, but states this feels very different.  HPI  History reviewed. No pertinent past medical history.  Patient Active Problem List   Diagnosis Date Noted  . Otitis media 04/19/2013  . Plantar fasciitis, bilateral 11/05/2012  . 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 Testicular pain 01/21/2012  . Elbow pain 01/21/2012    Past Surgical History:  Procedure Laterality Date  . Unremarkable         Home Medications    Prior to Admission medications   Medication Sig Start Date End Date Taking? Authorizing Provider  guaiFENesin (ROBITUSSIN) 100 MG/5ML liquid Take 5-10 mLs (100-200 mg total) by mouth every 4 (four) hours as needed for cough. 04/30/15   Marlon PelGreene, Tiffany, PA-C  sulfamethoxazole-trimethoprim (BACTRIM DS,SEPTRA DS) 800-160 MG tablet Take 1 tablet by mouth 2 (two) times daily for 7 days. 05/27/17 06/03/17  Reiana Poteet, PA-C    Family History Family History    Problem Relation Age of Onset  . Hypertension Father     Social History Social History   Tobacco Use  . Smoking status: Former Smoker    Packs/day: 1.00    Years: 30.00    Pack years: 30.00    Types: Cigarettes  Substance Use Topics  . Alcohol use: Yes    Comment: occasionally  . Drug use: No     Allergies   Patient has no known allergies.   Review of Systems Review of Systems  Genitourinary: Positive for penile swelling.       Left-sided groin pain  All other systems reviewed and are negative.    Physical Exam Updated Vital Signs BP (!) 129/91 (BP Location: Left Arm)   Pulse 67   Temp 98.7 F (37.1 C) (Oral)   Resp 16   Ht 6\' 1"  (1.854 m)   Wt 93.8 kg (206 lb 11.2 oz)   SpO2 96%   BMI 27.27 kg/m   Physical Exam  Constitutional: He is oriented to person, place, and time. He appears well-developed and well-nourished. No distress.  HENT:  Head: Normocephalic and atraumatic.  Eyes: EOM are normal.  Neck: Normal range of motion.  Cardiovascular: Normal rate, regular rhythm and intact distal pulses.  Pulmonary/Chest: Effort normal and breath sounds normal. No respiratory distress. He has no wheezes.  Abdominal: Soft. He exhibits no distension. There is no tenderness. There is no guarding. Hernia confirmed negative in the right inguinal area and  confirmed negative in the left inguinal area.  Genitourinary: Testes normal and penis normal. No penile erythema or penile tenderness. No discharge found.     Genitourinary Comments: Chaperone present.  Mild left inguinal swelling with swelling of the left side at the base of the shaft of the penis.  No hernia palpated.  No fluctuance or induration.  No penile shaft pain or swelling.  No discharge.  No scrotal pain or swelling. ?Skin erythema  Musculoskeletal: Normal range of motion.  Lymphadenopathy: Inguinal adenopathy noted on the left side. No inguinal adenopathy noted on the right side.  Neurological: He is alert  and oriented to person, place, and time.  Skin: Skin is warm. No rash noted.  Psychiatric: He has a normal mood and affect.  Nursing note and vitals reviewed.    ED Treatments / Results  Labs (all labs ordered are listed, but only abnormal results are displayed) Labs Reviewed  URINALYSIS, ROUTINE W REFLEX MICROSCOPIC    EKG  EKG Interpretation None       Radiology US Pelvis Limited (transabdominal Only)  Result Date: 05/27/2017 CLINICAL DATA:  Left groin mass for 3 days.  Evaluate for abscess. EXAM: LIMITED ULTRASOUND OF PELVIS TECHNIQUE: Limited transabdominal ultrasound examination of the pelvis was performed. COMPARISON:  Abdominal CT 09/24/2012 FINDINGS: Lymph nodes seen in the area of tenderness/mass, the largest measuring 24 x 11 x 15 mm. These maintain a reniform shape with hilar fat. There is no evidence of mass or abscess. IMPRESSION: 1. Negative for abscess. 2. Mild inguinal adenopathy, please correlate for regional inflammation. Electronically Signed   By: Marnee Spring M.D.   On: 05/27/2017 19:40    Procedures Procedures (including critical care time)  Medications Ordered in ED Medications - No data to display   Initial Impression / Assessment and Plan / ED Course  I have reviewed the triage vital signs and the nursing notes.  Pertinent labs & imaging results that were available during my care of the patient were reviewed by me and considered in my medical decision making (see chart for details).     Patient presenting for evaluation of left groin tenderness and swelling.  Physical exam is not concerning for hernia or torsion.  Unknown source of pain or swelling.  Doubt epididymitis, or prostatitis. No penile or testicular involvement.  No abdominal involvement.  Case discussed with attending, Dr. Fredderick Phenix evaluated the patient.  UA obtained, negative for infection.  Ultrasound obtained, shows mild left inguinal adenopathy.  No sign of abscess.  Possible early  cellulitis, as there is questionable erythema on exam.  Will discharge patient with Bactrim.  Follow-up with primary care.  At this time, patient appears safe for discharge.  Return precautions given.  Patient states he understands and agrees to plan.   Final Clinical Impressions(s) / ED Diagnoses   Final diagnoses:  Groin swelling  Cellulitis of other specified site    ED Discharge Orders        Ordered    sulfamethoxazole-trimethoprim (BACTRIM DS,SEPTRA DS) 800-160 MG tablet  2 times daily     05/27/17 [address], PA-C 05/27/17 2210    Rolan Bucco, MD 05/27/17 2329

## 2017-08-07 ENCOUNTER — Encounter: Payer: Self-pay | Admitting: Physician Assistant

## 2018-12-26 IMAGING — US US PELVIS LIMITED
1 series · 14 of 16 positions shown · non-contrast
Comparison: Abdominal CT 09/24/2012

CLINICAL DATA: Left groin mass for 3 days.  Evaluate for abscess.

EXAM:
LIMITED ULTRASOUND OF PELVIS
TECHNIQUE: Limited transabdominal ultrasound examination of the pelvis was
performed.

[Series 1: us pelvis limited · 0.07mm/px · 14 of 16 slices shown]
[im 1/16]
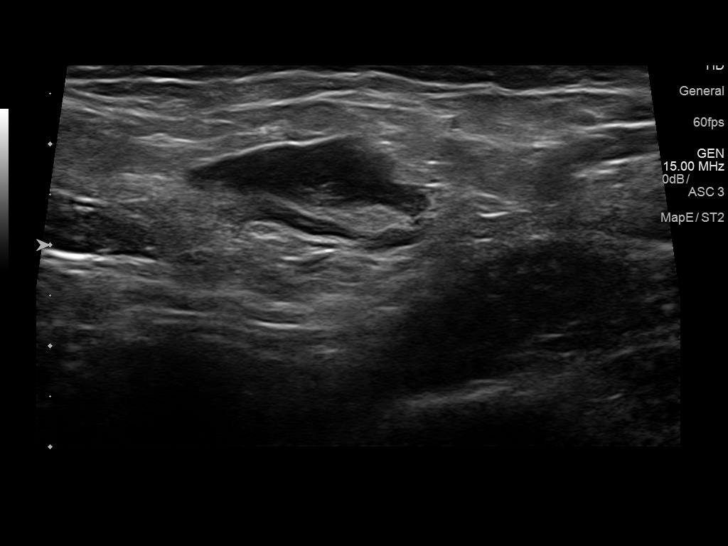
[im 2/16]
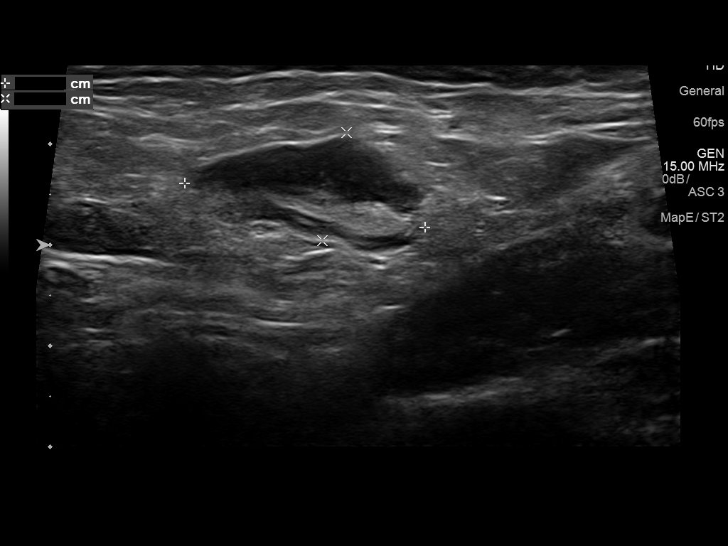
[im 3/16]
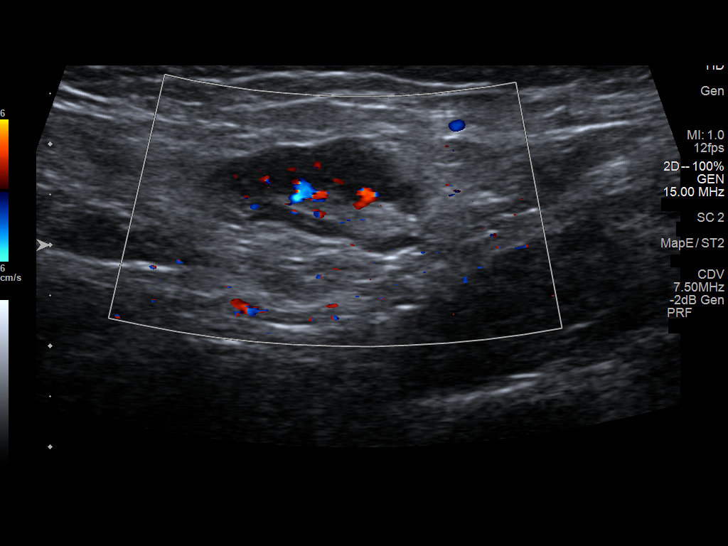
[im 5/16]
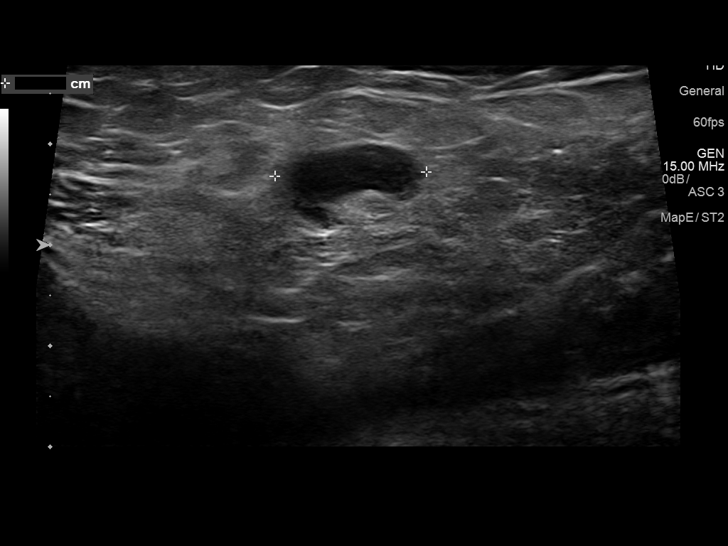
[im 6/16]
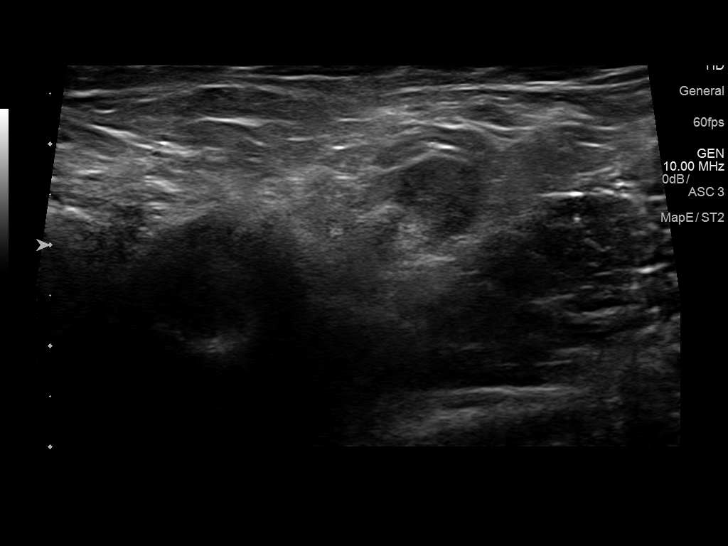
[im 7/16]
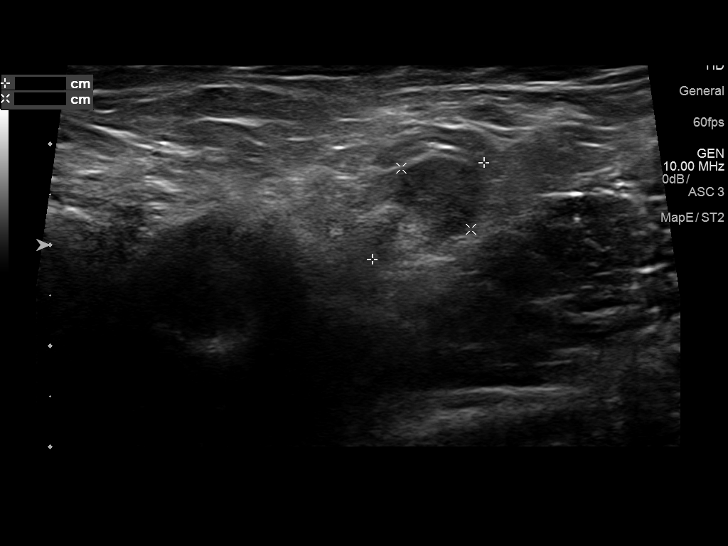
[im 8/16]
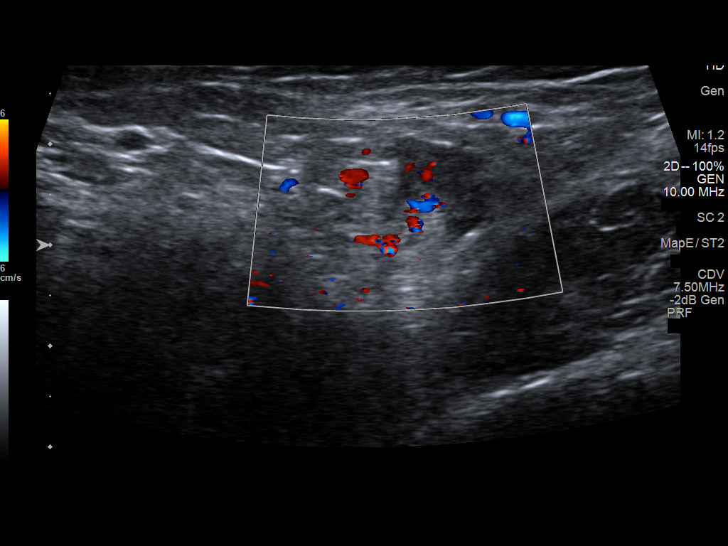
[im 9/16]
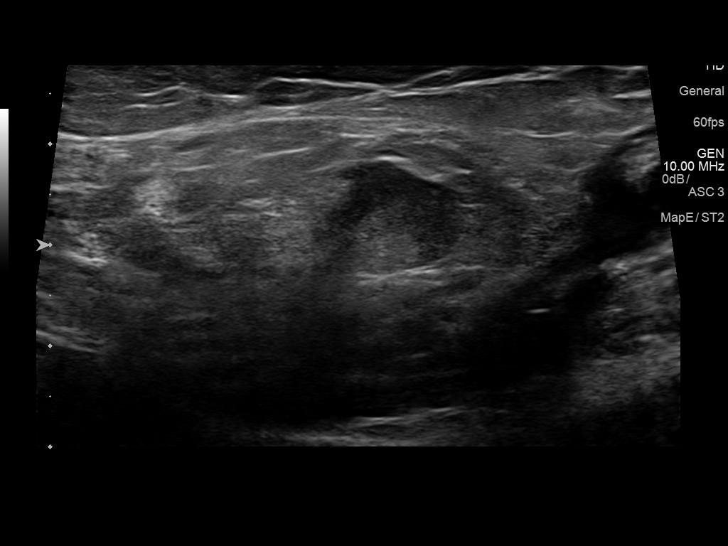
[im 10/16]
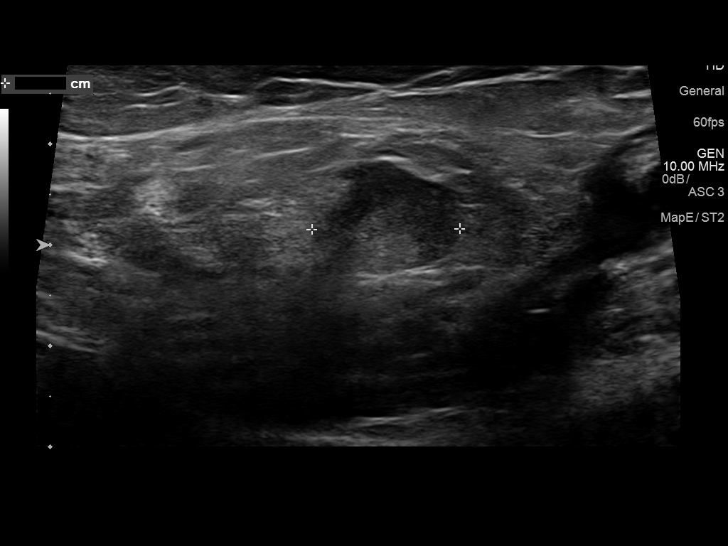
[im 11/16]
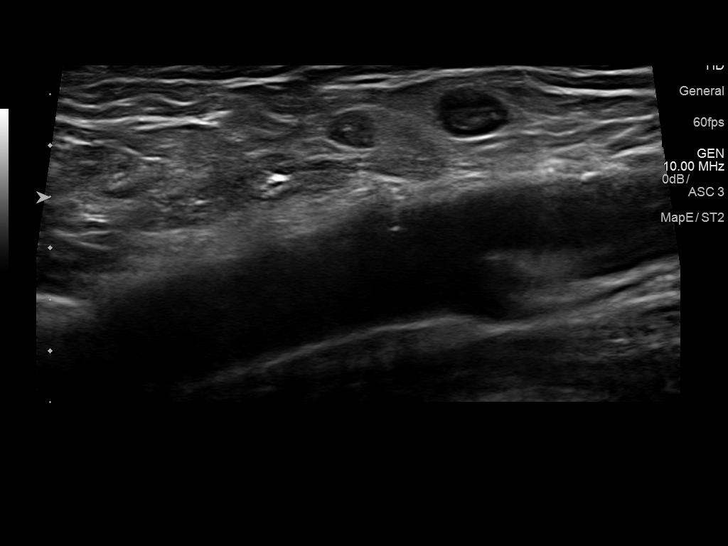
[im 13/16]
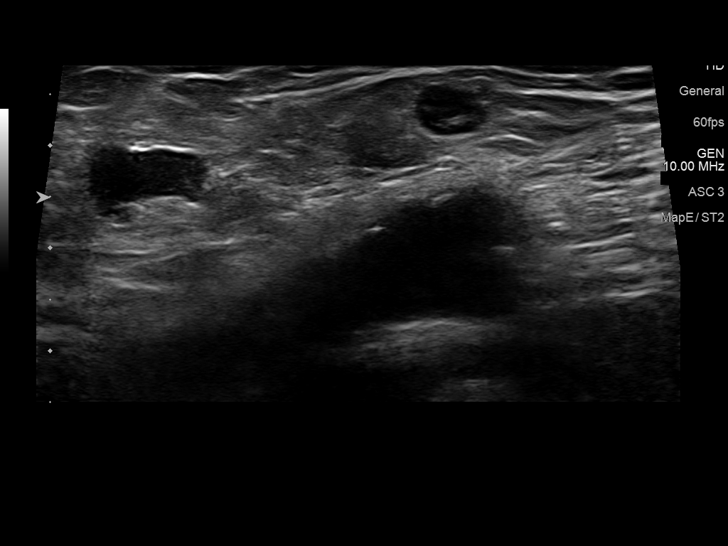
[im 14/16]
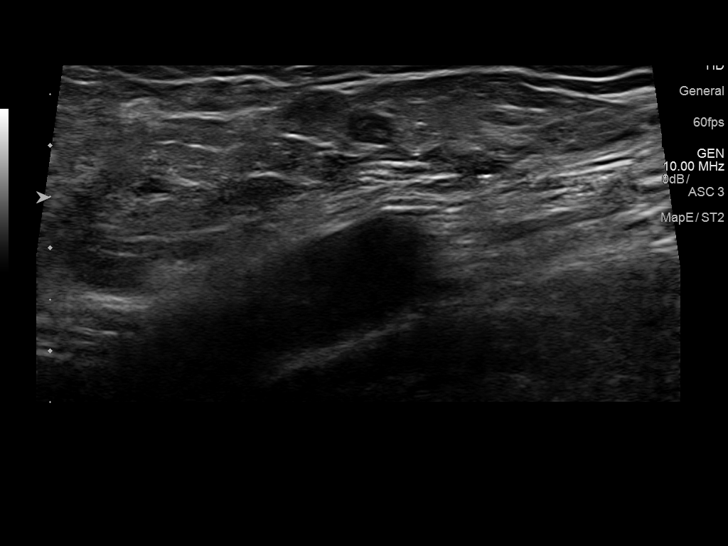
[im 15/16]
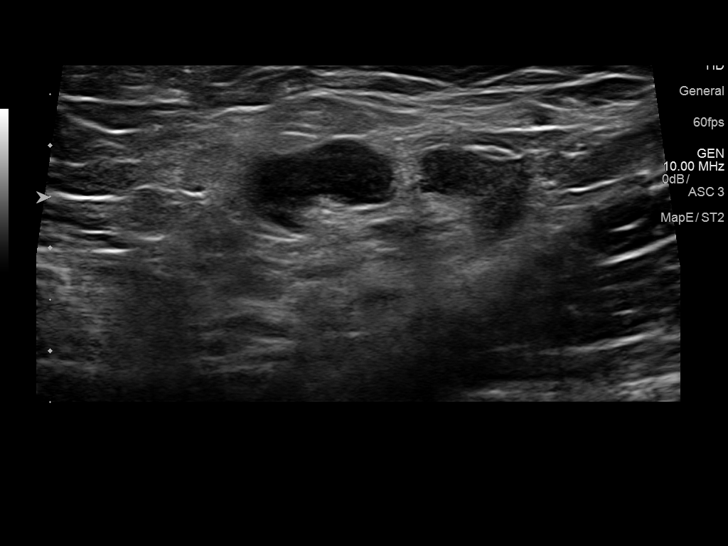
[im 16/16]
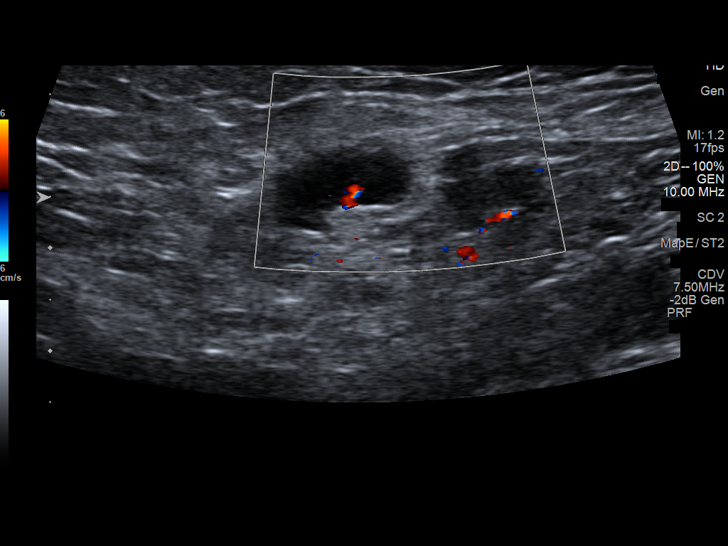

[14 of 16 positions shown; findings below may reference images not displayed]

FINDINGS: Lymph nodes seen in the area of tenderness/mass, the largest
measuring 24 x 11 x 15 mm. These maintain a reniform shape with
hilar fat. There is no evidence of mass or abscess.
IMPRESSION: 1. Negative for abscess.
2. Mild inguinal adenopathy, please correlate for regional
inflammation.
